# Patient Record
Sex: Male | Born: 1950 | Race: White | Hispanic: No | Marital: Married | Smoking: Former smoker
Health system: Southern US, Community
[De-identification: ages and names within clinical notes are randomized; demographics above are authoritative.]

## PROBLEM LIST (undated history)

## (undated) DIAGNOSIS — I639 Cerebral infarction, unspecified: Secondary | ICD-10-CM

## (undated) DIAGNOSIS — K219 Gastro-esophageal reflux disease without esophagitis: Secondary | ICD-10-CM

## (undated) DIAGNOSIS — H9319 Tinnitus, unspecified ear: Secondary | ICD-10-CM

## (undated) DIAGNOSIS — I10 Essential (primary) hypertension: Secondary | ICD-10-CM

## (undated) HISTORY — PX: COLONOSCOPY WITH ESOPHAGOGASTRODUODENOSCOPY (EGD): SHX5779

---

## 2004-04-08 ENCOUNTER — Ambulatory Visit: Payer: Self-pay

## 2011-11-13 ENCOUNTER — Ambulatory Visit: Payer: Self-pay | Admitting: Gastroenterology

## 2013-10-31 DIAGNOSIS — K219 Gastro-esophageal reflux disease without esophagitis: Secondary | ICD-10-CM | POA: Insufficient documentation

## 2013-10-31 DIAGNOSIS — E7800 Pure hypercholesterolemia, unspecified: Secondary | ICD-10-CM | POA: Insufficient documentation

## 2016-05-29 ENCOUNTER — Encounter: Payer: Self-pay | Admitting: *Deleted

## 2016-05-30 ENCOUNTER — Ambulatory Visit: Payer: Medicare Other | Admitting: Anesthesiology

## 2016-05-30 ENCOUNTER — Encounter
Admission: RE | Disposition: A | Discharge status: Home / Self Care | Payer: Self-pay | Source: Ambulatory Visit | Attending: Gastroenterology

## 2016-05-30 ENCOUNTER — Encounter: Payer: Self-pay | Admitting: Anesthesiology

## 2016-05-30 ENCOUNTER — Ambulatory Visit
Admission: RE | Admit: 2016-05-30 | Discharge: 2016-05-30 | Disposition: A | Discharge status: Home / Self Care | Payer: Medicare Other | Source: Ambulatory Visit | Attending: Gastroenterology | Admitting: Gastroenterology

## 2016-05-30 DIAGNOSIS — K621 Rectal polyp: Secondary | ICD-10-CM | POA: Diagnosis not present

## 2016-05-30 DIAGNOSIS — N183 Chronic kidney disease, stage 3 (moderate): Secondary | ICD-10-CM | POA: Insufficient documentation

## 2016-05-30 DIAGNOSIS — K640 First degree hemorrhoids: Secondary | ICD-10-CM | POA: Insufficient documentation

## 2016-05-30 DIAGNOSIS — Z1211 Encounter for screening for malignant neoplasm of colon: Secondary | ICD-10-CM | POA: Diagnosis not present

## 2016-05-30 DIAGNOSIS — K5730 Diverticulosis of large intestine without perforation or abscess without bleeding: Secondary | ICD-10-CM | POA: Insufficient documentation

## 2016-05-30 DIAGNOSIS — Z79899 Other long term (current) drug therapy: Secondary | ICD-10-CM | POA: Diagnosis not present

## 2016-05-30 DIAGNOSIS — K219 Gastro-esophageal reflux disease without esophagitis: Secondary | ICD-10-CM | POA: Insufficient documentation

## 2016-05-30 DIAGNOSIS — Z86010 Personal history of colonic polyps: Secondary | ICD-10-CM | POA: Insufficient documentation

## 2016-05-30 DIAGNOSIS — I129 Hypertensive chronic kidney disease with stage 1 through stage 4 chronic kidney disease, or unspecified chronic kidney disease: Secondary | ICD-10-CM | POA: Diagnosis not present

## 2016-05-30 DIAGNOSIS — Z7982 Long term (current) use of aspirin: Secondary | ICD-10-CM | POA: Diagnosis not present

## 2016-05-30 DIAGNOSIS — D128 Benign neoplasm of rectum: Secondary | ICD-10-CM | POA: Insufficient documentation

## 2016-05-30 HISTORY — DX: Tinnitus, unspecified ear: H93.19

## 2016-05-30 HISTORY — DX: Essential (primary) hypertension: I10

## 2016-05-30 HISTORY — DX: Gastro-esophageal reflux disease without esophagitis: K21.9

## 2016-05-30 HISTORY — PX: COLONOSCOPY WITH PROPOFOL: SHX5780

## 2016-05-30 SURGERY — COLONOSCOPY WITH PROPOFOL
Anesthesia: General

## 2016-05-30 MED ORDER — FENTANYL CITRATE (PF) 100 MCG/2ML IJ SOLN: 25.0000 ug | INTRAMUSCULAR | Status: DC | PRN

## 2016-05-30 MED ORDER — MIDAZOLAM HCL 2 MG/2ML IJ SOLN
INTRAMUSCULAR | Status: AC
  Filled 2016-05-30: qty 2

## 2016-05-30 MED ORDER — LIDOCAINE HCL (PF) 1 % IJ SOLN
INTRAMUSCULAR | Status: AC
  Administered 2016-05-30: 0.3 mL via INTRADERMAL
  Filled 2016-05-30: qty 2

## 2016-05-30 MED ORDER — PROPOFOL 500 MG/50ML IV EMUL
INTRAVENOUS | Status: AC
Start: 2016-05-30 — End: 2016-05-30
  Filled 2016-05-30: qty 50

## 2016-05-30 MED ORDER — LIDOCAINE HCL (PF) 2 % IJ SOLN
INTRAMUSCULAR | Status: AC
  Filled 2016-05-30: qty 2

## 2016-05-30 MED ORDER — MIDAZOLAM HCL 2 MG/2ML IJ SOLN
INTRAMUSCULAR | Status: DC | PRN
  Administered 2016-05-30: 1 mg via INTRAVENOUS

## 2016-05-30 MED ORDER — PROPOFOL 500 MG/50ML IV EMUL
INTRAVENOUS | Status: DC | PRN
  Administered 2016-05-30: 140 ug/kg/min via INTRAVENOUS

## 2016-05-30 MED ORDER — PROPOFOL 10 MG/ML IV BOLUS
INTRAVENOUS | Status: DC | PRN
  Administered 2016-05-30: 70 mg via INTRAVENOUS

## 2016-05-30 MED ORDER — ONDANSETRON HCL 4 MG/2ML IJ SOLN: 4.0000 mg | Freq: Once | INTRAMUSCULAR | Status: DC | PRN

## 2016-05-30 MED ORDER — LIDOCAINE HCL (CARDIAC) 20 MG/ML IV SOLN
INTRAVENOUS | Status: DC | PRN
  Administered 2016-05-30: 40 mg via INTRAVENOUS

## 2016-05-30 MED ORDER — LIDOCAINE HCL (PF) 1 % IJ SOLN
2.0000 mL | Freq: Once | INTRAMUSCULAR | Status: AC
  Administered 2016-05-30: 0.3 mL via INTRADERMAL

## 2016-05-30 MED ORDER — SODIUM CHLORIDE 0.9 % IV SOLN
INTRAVENOUS | Status: DC
  Administered 2016-05-30: 1000 mL via INTRAVENOUS

## 2016-05-30 MED ORDER — SODIUM CHLORIDE 0.9 % IV SOLN: INTRAVENOUS | Status: DC

## 2016-05-30 NOTE — Anesthesia Procedure Notes (Signed)
Date/Time: 05/30/2016 9:50 AM Performed by: Hedda Slade Pre-anesthesia Checklist: Patient identified, Emergency Drugs available, Suction available, Patient being monitored and Timeout performed Patient Re-evaluated:Patient Re-evaluated prior to inductionOxygen Delivery Method: Nasal cannula

## 2016-05-30 NOTE — Anesthesia Preprocedure Evaluation (Addendum)
Anesthesia Evaluation  Patient identified by MRN, date of birth, ID band Patient awake    Reviewed: Allergy & Precautions, NPO status , Patient's Chart, lab work & pertinent test results  Airway Mallampati: II  TM Distance: <3 FB     Dental  (+) Caps   Pulmonary neg pulmonary ROS,    Pulmonary exam normal        Cardiovascular hypertension, Pt. on medications Normal cardiovascular exam     Neuro/Psych negative neurological ROS  negative psych ROS   GI/Hepatic GERD  Medicated,  Endo/Other  negative endocrine ROS  Renal/GU Renal InsufficiencyRenal disease  negative genitourinary   Musculoskeletal negative musculoskeletal ROS (+)   Abdominal Normal abdominal exam  (+)   Peds negative pediatric ROS (+)  Hematology negative hematology ROS (+)   Anesthesia Other Findings Past Medical History: No date: CKD (chronic kidney disease) stage 3, GFR 30-5* No date: GERD (gastroesophageal reflux disease) No date: Hypertension No date: Tinnitus  Reproductive/Obstetrics                            Anesthesia Physical Anesthesia Plan  ASA: III  Anesthesia Plan: General   Post-op Pain Management:    Induction: Intravenous  Airway Management Planned: Nasal Cannula  Additional Equipment:   Intra-op Plan:   Post-operative Plan:   Informed Consent: I have reviewed the patients History and Physical, chart, labs and discussed the procedure including the risks, benefits and alternatives for the proposed anesthesia with the patient or authorized representative who has indicated his/her understanding and acceptance.   Dental advisory given  Plan Discussed with: CRNA and Surgeon  Anesthesia Plan Comments:         Anesthesia Quick Evaluation

## 2016-05-30 NOTE — Anesthesia Post-op Follow-up Note (Cosign Needed)
Anesthesia QCDR form completed.        

## 2016-05-30 NOTE — Op Note (Signed)
[facility] Gastroenterology Patient Name: Joel Ball Procedure Date: 05/30/2016 9:39 AM MRN: [id number] Account #: [id number] Date of Birth: 24-Aug-1950 Admit Type: Outpatient Age: 66 Room: [facility] ENDO ROOM 3 Gender: Male Note Status: Finalized Procedure:            Colonoscopy Indications:          Personal history of colonic polyps Providers:            Lollie Sails, MD Medicines:            Monitored Anesthesia Care Complications:        No immediate complications. Procedure:            Pre-Anesthesia Assessment:                       - ASA Grade Assessment: III - A patient with severe                        systemic disease.                       After obtaining informed consent, the colonoscope was                        passed under direct vision. Throughout the procedure,                        the patient's blood pressure, pulse, and oxygen                        saturations were monitored continuously. The                        Colonoscope was introduced through the anus and                        advanced to the the cecum, identified by appendiceal                        orifice and ileocecal valve. The quality of the bowel                        preparation was good. Findings:      Two sessile polyps were found in the rectum. The polyps were 1 to 2 mm       in size. These polyps were removed with a cold biopsy forceps. Resection       and retrieval were complete.      Multiple small and large-mouthed diverticula were found in the sigmoid       colon and distal descending colon.      Non-bleeding internal hemorrhoids were found during anoscopy. The       hemorrhoids were small and Grade I (internal hemorrhoids that do not       prolapse). Impression:           - Two 1 to 2 mm polyps in the rectum, removed with a                        cold biopsy forceps. Resected and retrieved.                       -  Diverticulosis in the sigmoid colon and in  the distal                        descending colon.                       - Non-bleeding internal hemorrhoids. Recommendation:       - Await pathology results.                       - Telephone GI clinic for pathology results in 1 week. Procedure Code(s):    --- Professional ---                       [phone], Colonoscopy, flexible; with biopsy, single or                        multiple Diagnosis Code(s):    --- Professional ---                       K62.1, Rectal polyp                       K64.0, First degree hemorrhoids                       Z86.010, Personal history of colonic polyps                       K57.30, Diverticulosis of large intestine without                        perforation or abscess without bleeding CPT copyright 2016 American Medical Association. All rights reserved. The codes documented in this report are preliminary and upon coder review may  be revised to meet current compliance requirements. Lollie Sails, MD 05/30/2016 10:30:26 AM This report has been signed electronically. Number of Addenda: 0 Note Initiated On: 05/30/2016 9:39 AM Scope Withdrawal Time: 0 hours 8 minutes 13 seconds  Total Procedure Duration: 0 hours 14 minutes 25 seconds       [facility]

## 2016-05-30 NOTE — Transfer of Care (Signed)
Immediate Anesthesia Transfer of Care Note  Patient: Joel Ball.  Procedure(s) Performed: Procedure(s): COLONOSCOPY WITH PROPOFOL (N/A)  Patient Location: PACU  Anesthesia Type:General  Level of Consciousness: awake, alert  and oriented  Airway & Oxygen Therapy: Patient Spontanous Breathing and Patient connected to nasal cannula oxygen  Post-op Assessment: Report given to RN and Post -op Vital signs reviewed and stable  Post vital signs: Reviewed and stable  Last Vitals:  Vitals:   05/30/16 0845 05/30/16 1015  BP: 139/74 96/71  Pulse: 67 60  Resp: 17 18  Temp: (!) 36.1 C 36.3 C    Last Pain:  Vitals:   05/30/16 1015  TempSrc: Tympanic         Complications: No apparent anesthesia complications

## 2016-05-30 NOTE — H&P (Signed)
Outpatient short stay form Pre-procedure 05/30/2016 9:44 AM Lollie Sails MD  Primary Physician: Dr Derinda Late  Reason for visit:  Colonoscopy  History of present illness:  Patient is a 66 year old male presenting today as above. He has a personal history of adenomatous colon polyps. He tolerated his prep well. He takes 81 mg aspirin that he is held for a couple of days. He takes no other blood thinning agents or aspirin products. His last colonoscopy was in 2013.    Current Facility-Administered Medications:  .  0.9 %  sodium chloride infusion, , Intravenous, Continuous, Lollie Sails, MD, Last Rate: 20 mL/hr at 05/30/16 0902, 1,000 mL at 05/30/16 0902 .  0.9 %  sodium chloride infusion, , Intravenous, Continuous, Lollie Sails, MD .  fentaNYL (SUBLIMAZE) injection 25 mcg, 25 mcg, Intravenous, Q5 min PRN, Alvin Critchley, MD .  ondansetron [facility]) injection 4 mg, 4 mg, Intravenous, Once PRN, Alvin Critchley, MD  Prescriptions Prior to Admission  Medication Sig Dispense Refill Last Dose  . aspirin EC 81 MG tablet Take 81 mg by mouth daily.   05/25/2016  . losartan (COZAAR) 100 MG tablet Take 100 mg by mouth daily.     Marland Kitchen omeprazole (PRILOSEC) 20 MG capsule Take 20 mg by mouth daily.     . rosuvastatin (CRESTOR) 5 MG tablet Take 5 mg by mouth daily.        Allergies  Allergen Reactions  . Statins      Past Medical History:  Diagnosis Date  . CKD (chronic kidney disease) stage 3, GFR 30-59 ml/min   . GERD (gastroesophageal reflux disease)   . Hypertension   . Tinnitus     Review of systems:      Physical Exam    Heart and lungs: Regular rate and rhythm without rub or gallop, lungs are bilaterally clear.    HEENT: Normocephalic atraumatic eyes are anicteric    Other:     Pertinant exam for procedure: Soft nontender nondistended bowel sounds are positive normoactive    Planned proceedures: Colonoscopy and indicated procedures I have discussed the risks  benefits and complications of procedures to include not limited to bleeding, infection, perforation and the risk of sedation and the patient wishes to proceed.    Lollie Sails, MD Gastroenterology 05/30/2016  9:44 AM

## 2016-05-31 ENCOUNTER — Encounter: Payer: Self-pay | Admitting: Gastroenterology

## 2016-05-31 LAB — SURGICAL PATHOLOGY

## 2016-05-31 NOTE — Anesthesia Postprocedure Evaluation (Signed)
Anesthesia Post Note  Patient: Joel Ball.  Procedure(s) Performed: Procedure(s) (LRB): COLONOSCOPY WITH PROPOFOL (N/A)  Patient location during evaluation: PACU Anesthesia Type: General Level of consciousness: awake and alert and oriented Pain management: pain level controlled Vital Signs Assessment: post-procedure vital signs reviewed and stable Respiratory status: spontaneous breathing Cardiovascular status: blood pressure returned to baseline Anesthetic complications: no     Last Vitals:  Vitals:   05/30/16 1030 05/30/16 1040  BP: 118/72 115/72  Pulse: 65 60  Resp: 17 12  Temp:      Last Pain:  Vitals:   05/30/16 1015  TempSrc: Tympanic                 Biviana Saddler

## 2016-09-07 ENCOUNTER — Other Ambulatory Visit: Payer: Self-pay | Admitting: Family Medicine

## 2016-09-07 DIAGNOSIS — G453 Amaurosis fugax: Secondary | ICD-10-CM

## 2016-09-11 ENCOUNTER — Ambulatory Visit
Admission: RE | Admit: 2016-09-11 | Discharge: 2016-09-11 | Disposition: A | Discharge status: Home / Self Care | Payer: Medicare Other | Source: Ambulatory Visit | Attending: Family Medicine | Admitting: Family Medicine

## 2016-09-11 DIAGNOSIS — I6523 Occlusion and stenosis of bilateral carotid arteries: Secondary | ICD-10-CM | POA: Insufficient documentation

## 2016-09-11 DIAGNOSIS — G453 Amaurosis fugax: Secondary | ICD-10-CM | POA: Diagnosis present

## 2016-09-13 ENCOUNTER — Encounter (INDEPENDENT_AMBULATORY_CARE_PROVIDER_SITE_OTHER): Payer: Self-pay | Admitting: Vascular Surgery

## 2016-09-13 ENCOUNTER — Ambulatory Visit (INDEPENDENT_AMBULATORY_CARE_PROVIDER_SITE_OTHER): Payer: Medicare Other | Admitting: Vascular Surgery

## 2016-09-13 VITALS — BP 121/69 | HR 70 | Resp 17 | Wt 182.0 lb

## 2016-09-13 DIAGNOSIS — I10 Essential (primary) hypertension: Secondary | ICD-10-CM | POA: Diagnosis not present

## 2016-09-13 DIAGNOSIS — H34231 Retinal artery branch occlusion, right eye: Secondary | ICD-10-CM | POA: Insufficient documentation

## 2016-09-13 DIAGNOSIS — H34232 Retinal artery branch occlusion, left eye: Secondary | ICD-10-CM | POA: Diagnosis not present

## 2016-09-13 DIAGNOSIS — I6523 Occlusion and stenosis of bilateral carotid arteries: Secondary | ICD-10-CM | POA: Diagnosis not present

## 2016-09-13 DIAGNOSIS — I6529 Occlusion and stenosis of unspecified carotid artery: Secondary | ICD-10-CM | POA: Insufficient documentation

## 2016-09-13 NOTE — Patient Instructions (Signed)
Carotid Artery Disease The carotid arteries are arteries on both sides of the neck. They carry blood to the brain. Carotid artery disease is when the arteries get smaller (narrow) or get blocked. If these arteries get smaller or get blocked, you are more likely to have a stroke or warning stroke (transient ischemic attack). Follow these instructions at home:  Take medicines as told by your doctor. Make sure you understand all your medicine instructions. Do not stop your medicines without talking to your doctor first.  Follow your doctor's diet instructions. It is important to eat a healthy diet that includes plenty of: ? Fresh fruits. ? Vegetables. ? Lean meats.  Avoid: ? High-fat foods. ? High-sodium foods. ? Foods that are fried, overly processed, or have poor nutritional value.  Stay a healthy weight.  Stay active. Get at least 30 minutes of activity every day.  Do not smoke.  Limit alcohol use to: ? No more than 2 drinks a day for men. ? No more than 1 drink a day for women who are not pregnant.  Do not use illegal drugs.  Keep all doctor visits as told. Get help right away if:  You have sudden weakness or loss of feeling (numbness) on one side of the body, such as the face, arm, or leg.  You have sudden confusion.  You have trouble speaking (aphasia) or understanding.  You have sudden trouble seeing out of one or both eyes.  You have sudden trouble walking.  You have dizziness or feel like you might pass out (faint).  You have a loss of balance or your movements are not steady (uncoordinated).  You have a sudden, severe headache with no known cause.  You have trouble swallowing (dysphagia). Call your local emergency services (911 in [place] Do notdrive yourself to the clinic or hospital. This information is not intended to replace advice given to you by your health care provider. Make sure you discuss any questions you have with your health care  provider. Document Released: 01/03/2012 Document Revised: 06/24/2015 Document Reviewed: 07/17/2012 Elsevier Interactive Patient Education  2018 Elsevier Inc.  

## 2016-09-13 NOTE — Assessment & Plan Note (Signed)
blood pressure control important in reducing the progression of atherosclerotic disease. On appropriate oral medications.  

## 2016-09-13 NOTE — Assessment & Plan Note (Signed)
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

## 2016-09-13 NOTE — Assessment & Plan Note (Signed)
The patient has left sided visual symptoms prompting a carotid evaluation. The patient has now progressed and has a lesion the is >70%.  Patient should undergo CT angiography of the carotid arteries to define the degree of stenosis of the internal carotid arteries bilaterally and the anatomic suitability for surgery vs. intervention.  If the patient does indeed need surgery cardiac clearance will be required, once cleared the patient will be scheduled for surgery.  The risks, benefits and alternative therapies were reviewed in detail with the patient.  All questions were answered.  The patient agrees to proceed with imaging.  Continue antiplatelet therapy as prescribed. Continue management of CAD, HTN and Hyperlipidemia. Healthy heart diet, encouraged exercise at least 4 times per week.

## 2016-09-13 NOTE — Progress Notes (Signed)
Patient ID: Joel Ball., male   DOB: 10-14-1950, 66 y.o.   MRN: [id number]  Chief Complaint  Patient presents with  . carotid stenosis    HPI Joel Ball. is a 66 y.o. male.  I am asked to see the patient by Dr. Baldemar Lenis for evaluation of carotid artery stenosis.  The patient reports one week ago waking up with a visual field defect. There were no inciting events. On evaluation, this was largely in the left eye. If he closed his left eye, he did not see the defect. He described the area as looking like the [place]. 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 This resulted in a carotid duplex performed earlier this week which had velocities that were interpreted by the radiologist as having high-grade, 70-99% carotid artery stenosis bilaterally. The right side velocities would certainly be borderline for this but the left side velocities were likely in the high-grade range. He denies arm or leg weakness or numbness. He denies speech or swallowing difficulty. No fever or chills.   Past Medical History:  Diagnosis Date  . CKD (chronic kidney disease) stage 3, GFR 30-59 ml/min   . GERD (gastroesophageal reflux disease)   . Hypertension   . Tinnitus     Past Surgical History:  Procedure Laterality Date  . COLONOSCOPY WITH ESOPHAGOGASTRODUODENOSCOPY (EGD)    . COLONOSCOPY WITH PROPOFOL N/A 05/30/2016   Procedure: COLONOSCOPY WITH PROPOFOL;  Surgeon: Lollie Sails, MD;  Location: [facility] ENDOSCOPY;  Service: Endoscopy;  Laterality: N/A;    Family History No bleeding disorders, clotting disorders, aneurysms, or porphyrias  Social History Social History  Substance Use Topics  . Smoking status: Never Smoker  . Smokeless tobacco: Never Used  . Alcohol use No  No IVDU  Allergies  Allergen Reactions  . Statins     Current Outpatient Prescriptions    Medication Sig Dispense Refill  . aspirin EC 81 MG tablet Take 81 mg by mouth daily.    Marland Kitchen losartan (COZAAR) 100 MG tablet Take 100 mg by mouth daily.    Marland Kitchen omeprazole (PRILOSEC) 20 MG capsule Take 20 mg by mouth daily.    . rosuvastatin (CRESTOR) 5 MG tablet Take 5 mg by mouth daily.     No current facility-administered medications for this visit.       REVIEW OF SYSTEMS (Negative unless checked)  Constitutional: [] Weight loss  [] Fever  [] Chills Cardiac: [] Chest pain   [] Chest pressure   [] Palpitations   [] Shortness of breath when laying flat   [] Shortness of breath at rest   [] Shortness of breath with exertion. Vascular:  [] Pain in legs with walking   [] Pain in legs at rest   [] Pain in legs when laying flat   [] Claudication   [] Pain in feet when walking  [] Pain in feet at rest  [] Pain in feet when laying flat   [] History of DVT   [] Phlebitis   [] Swelling in legs   [] Varicose veins   [] Non-healing ulcers Pulmonary:   [] Uses home oxygen   [] Productive cough   [] Hemoptysis   [] Wheeze  [] COPD   [] Asthma Neurologic:  [] Dizziness  [] Blackouts   [] Seizures   [] History of stroke   [] History of TIA  [] Aphasia   [x] Temporary blindness   [] Dysphagia   [] Weakness or numbness in arms   [] Weakness or numbness in legs Musculoskeletal:  []   Arthritis   [] Joint swelling   [] Joint pain   [] Low back pain Hematologic:  [] Easy bruising  [] Easy bleeding   [] Hypercoagulable state   [] Anemic  [] Hepatitis Gastrointestinal:  [] Blood in stool   [] Vomiting blood  [x] Gastroesophageal reflux/heartburn   [] Abdominal pain Genitourinary:  [x] Chronic kidney disease   [] Difficult urination  [] Frequent urination  [] Burning with urination   [] Hematuria Skin:  [] Rashes   [] Ulcers   [] Wounds Psychological:  [] History of anxiety   []  History of major depression.    Physical Exam BP 121/69   Pulse 70   Resp 17   Wt 82.6 kg (182 lb)   BMI 27.67 kg/m  Gen:  WD/WN, NAD Head: [place]/AT, No temporalis wasting.  Ear/Nose/Throat:  Hearing grossly intact, nares w/o erythema or drainage, oropharynx w/o Erythema/Exudate Eyes: Conjunctiva clear, sclera non-icteric  Neck: trachea midline.  No JVD. Very soft right carotid bruit.  Pulmonary:  Good air movement, clear to auscultation bilaterally.  Cardiac: RRR, normal S1, S2 Vascular:  Vessel Right Left  Radial Palpable Palpable                      Popliteal Palpable Palpable  PT Palpable Palpable  DP Palpable Palpable   Gastrointestinal: soft, non-tender/non-distended. Musculoskeletal: M/S 5/5 throughout.  Extremities without ischemic changes.  No deformity or atrophy. No edema. Neurologic: Sensation grossly intact in extremities.  Symmetrical.  Speech is fluent. Motor exam as listed above. Psychiatric: Judgment intact, Mood & affect appropriate for pt's clinical situation. Dermatologic: No rashes or ulcers noted.  No cellulitis or open wounds.    Radiology US Carotid Bilateral  Result Date: 09/11/2016 CLINICAL DATA:  66 year old male with left amaurosis fugax EXAM: BILATERAL CAROTID DUPLEX ULTRASOUND TECHNIQUE: Pearline Cables scale imaging, color Doppler and duplex ultrasound were performed of bilateral carotid and vertebral arteries in the neck. COMPARISON:  None. FINDINGS: Criteria: Quantification of carotid stenosis is based on velocity parameters that correlate the residual internal carotid diameter with NASCET-based stenosis levels, using the diameter of the distal internal carotid lumen as the denominator for stenosis measurement. The following velocity measurements were obtained: RIGHT ICA:  225/64 cm/sec CCA:  601/09 cm/sec SYSTOLIC ICA/CCA RATIO:  2.1 DIASTOLIC ICA/CCA RATIO:  2.5 ECA:  98 cm/sec LEFT ICA:  287/110 cm/sec CCA:  32/35 cm/sec SYSTOLIC ICA/CCA RATIO:  3.0 DIASTOLIC ICA/CCA RATIO:  5.2 ECA:  131 cm/sec RIGHT CAROTID ARTERY: Bulky heterogeneous atherosclerotic plaque resulting in significant elevation of the peak systolic velocity as well as spectral  broadening, and high velocity jetting. By peak systolic velocity criteria the estimated stenosis falls in the 70- 99% range. RIGHT VERTEBRAL ARTERY:  Patent with normal antegrade flow. LEFT CAROTID ARTERY: Bulky heterogeneous atherosclerotic plaque in the proximal internal carotid artery. By peak systolic velocity criteria the estimated stenosis is a greater than 70%. There is spectral broadening and high velocity jetting. LEFT VERTEBRAL ARTERY:  Patent with normal antegrade flow. IMPRESSION: 1. High grade (70-99%) stenosis proximal right internal carotid artery secondary to bulky heterogeneous atherosclerotic plaque. 2. High grade (70-99%) stenosis proximal left internal carotid artery secondary to bulky heterogeneous atherosclerotic plaque. Subjectively, the left-sided stenosis is greater than the right. 3. Vertebral arteries are patent with normal antegrade flow. Signed, Criselda Peaches, MD [facility] Radiology Electronically Signed   By: Jacqulynn Cadet M.D.   On: 09/11/2016 12:09    Labs No results found for this or any previous visit (from the past 2160 hour(s)).  Assessment/Plan:  Hypertension  blood pressure control important in reducing the progression of atherosclerotic disease. On appropriate oral medications.   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 The patient has now progressed and has a lesion the is >70%.  Patient should undergo CT angiography of the carotid arteries to define the degree of stenosis of the internal carotid arteries bilaterally and the anatomic suitability for surgery vs. intervention.  If the patient does indeed need surgery cardiac clearance will be required, once cleared the patient will be scheduled for surgery.  The risks, benefits and alternative therapies were  reviewed in detail with the patient.  All questions were answered.  The patient agrees to proceed with imaging.  Continue antiplatelet therapy as prescribed. Continue management of CAD, HTN and Hyperlipidemia. Healthy heart diet, encouraged exercise at least 4 times per week.        Leotis Pain 09/13/2016, 10:03 AM   This note was created with Dragon medical transcription system.  Any errors from dictation are unintentional.

## 2016-09-14 ENCOUNTER — Telehealth (INDEPENDENT_AMBULATORY_CARE_PROVIDER_SITE_OTHER): Payer: Self-pay | Admitting: Vascular Surgery

## 2016-09-14 ENCOUNTER — Ambulatory Visit
Admission: RE | Admit: 2016-09-14 | Discharge: 2016-09-14 | Disposition: A | Discharge status: Home / Self Care | Payer: Medicare Other | Source: Ambulatory Visit | Attending: Vascular Surgery | Admitting: Vascular Surgery

## 2016-09-14 DIAGNOSIS — I6523 Occlusion and stenosis of bilateral carotid arteries: Secondary | ICD-10-CM | POA: Insufficient documentation

## 2016-09-14 LAB — POCT I-STAT CREATININE: Creatinine, Ser: 1.4 mg/dL — ABNORMAL HIGH (ref 0.61–1.24)

## 2016-09-14 MED ORDER — IOPAMIDOL (ISOVUE-370) INJECTION 76%
75.0000 mL | Freq: Once | INTRAVENOUS | Status: AC | PRN
  Administered 2016-09-14: 75 mL via INTRAVENOUS

## 2016-09-14 NOTE — Telephone Encounter (Signed)
Getting patient scheduled for his LCEA on 09/27/16

## 2016-09-14 NOTE — Telephone Encounter (Signed)
Remind me to call him tomorrow and lets go ahead and get him scheduled for left carotid endarterectomy

## 2016-09-14 NOTE — Telephone Encounter (Signed)
SCHEDULING IS SAYING THAT DR Lucky Cowboy NEEDS TO SIGN OFF ON Keian'S AUTHORIZATION IN THE WORK QUE. THEY WENT ADD AND GOT HIM IN TODAY

## 2016-09-14 NOTE — Telephone Encounter (Signed)
Imaging got Joel Ball in today. Say that they need you to sign off in the work que.  Also his report is: 1. Severe bulk stenosis over 75% due to calcified plaque 2. 50-60% ICA bulk stenosis 3.[place] patent co dominant vertabral artery

## 2016-09-15 ENCOUNTER — Encounter (INDEPENDENT_AMBULATORY_CARE_PROVIDER_SITE_OTHER): Payer: Self-pay

## 2016-09-19 ENCOUNTER — Other Ambulatory Visit (INDEPENDENT_AMBULATORY_CARE_PROVIDER_SITE_OTHER): Payer: Self-pay

## 2016-09-20 ENCOUNTER — Encounter
Admission: RE | Admit: 2016-09-20 | Discharge: 2016-09-20 | Disposition: A | Discharge status: Home / Self Care | Payer: Medicare Other | Source: Ambulatory Visit | Attending: Vascular Surgery | Admitting: Vascular Surgery

## 2016-09-20 DIAGNOSIS — I10 Essential (primary) hypertension: Secondary | ICD-10-CM | POA: Diagnosis present

## 2016-09-20 DIAGNOSIS — Z01812 Encounter for preprocedural laboratory examination: Secondary | ICD-10-CM | POA: Diagnosis present

## 2016-09-20 DIAGNOSIS — Z01810 Encounter for preprocedural cardiovascular examination: Secondary | ICD-10-CM | POA: Insufficient documentation

## 2016-09-20 LAB — PROTIME-INR
INR: 0.97
PROTHROMBIN TIME: 12.9 s (ref 11.4–15.2)

## 2016-09-20 LAB — COMPREHENSIVE METABOLIC PANEL
ALT: 21 U/L (ref 17–63)
ANION GAP: 7 (ref 5–15)
AST: 22 U/L (ref 15–41)
Albumin: 4.3 g/dL (ref 3.5–5.0)
Alkaline Phosphatase: 43 U/L (ref 38–126)
BUN: 16 mg/dL (ref 6–20)
CHLORIDE: 106 mmol/L (ref 101–111)
CO2: 29 mmol/L (ref 22–32)
CREATININE: 1.22 mg/dL (ref 0.61–1.24)
Calcium: 9.5 mg/dL (ref 8.9–10.3)
GFR calc non Af Amer: 59 mL/min — ABNORMAL LOW (ref >60)
Glucose, Bld: 92 mg/dL (ref 65–99)
POTASSIUM: 4.6 mmol/L (ref 3.5–5.1)
SODIUM: 142 mmol/L (ref 135–145)
Total Bilirubin: 0.7 mg/dL (ref 0.3–1.2)
Total Protein: 7.4 g/dL (ref 6.5–8.1)

## 2016-09-20 LAB — TYPE AND SCREEN
ABO/RH(D): O POS
ANTIBODY SCREEN: NEGATIVE

## 2016-09-20 LAB — APTT: APTT: 29 s (ref 24–36)

## 2016-09-20 LAB — SURGICAL PCR SCREEN
MRSA, PCR: NEGATIVE
STAPHYLOCOCCUS AUREUS: NEGATIVE

## 2016-09-20 NOTE — Patient Instructions (Signed)
  Your procedure is scheduled on:Wednesday August 29 , 2018. Report to Same Day Surgery. To find out your arrival time please call [phone] between 1PM - 3PM on Tuesday September 26, 2016.  Remember: Instructions that are not followed completely may result in serious medical risk, up to and including death, or upon the discretion of your surgeon and anesthesiologist your surgery may need to be rescheduled.    _x___ 1. Do not eat food or drink liquids after midnight. No gum chewing or hard candies.     _x___ 2. No Alcohol for 24 hours before or after surgery.   ____ 3. Bring all medications with you on the day of surgery if instructed.    __x__ 4. Notify your doctor if there is any change in your medical condition     (cold, fever, infections).    _____ 5. No smoking 24 hours prior to surgery.     Do not wear jewelry, make-up, hairpins, clips or nail polish.  Do not wear lotions, powders, or perfumes.   Do not shave 48 hours prior to surgery. Men may shave face and neck.  Do not bring valuables to the hospital.    [facility] is not responsible for any belongings or valuables.               Contacts, dentures or bridgework may not be worn into surgery.  Leave your suitcase in the car. After surgery it may be brought to your room.  For patients admitted to the hospital, discharge time is determined by your treatment team.   Patients discharged the day of surgery will not be allowed to drive home.    Please read over the following fact sheets that you were given:   [facility] Preparing for Surgery  __x__ Take these medicines the morning of surgery with A SIP OF WATER:    1. omeprazole (PRILOSEC) take at bedtime the night prior to surgery and the morning of surgery.    ____ Fleet Enema (as directed)   __x__ Use CHG Soap as directed on instruction sheet  ____ Use inhalers on the day of surgery and bring to hospital day of surgery  ____ Stop metformin 2 days prior to  surgery    ____ Take 1/2 of usual insulin dose the night before surgery and none on the morning of surgery.   ____ Stop Coumadin/Plavix/aspirin on does not apply.  __x__ Stop Anti-inflammatories such as Advil, Aleve, Ibuprofen, Motrin, Naproxen, EXCEDRIN MIGRAINE, Naprosyn,  Goodies powders or aspirin products. OK to take Tylenol.   ____ Stop supplements until after surgery.    ____ Bring C-Pap to the hospital.

## 2016-09-26 ENCOUNTER — Encounter (INDEPENDENT_AMBULATORY_CARE_PROVIDER_SITE_OTHER): Payer: Self-pay | Admitting: Vascular Surgery

## 2016-09-27 ENCOUNTER — Encounter: Payer: Self-pay | Admitting: *Deleted

## 2016-09-27 ENCOUNTER — Inpatient Hospital Stay: Payer: Medicare Other | Admitting: Anesthesiology

## 2016-09-27 ENCOUNTER — Inpatient Hospital Stay
Admission: RE | Admit: 2016-09-27 | Discharge: 2016-09-28 | DRG: 039 | Disposition: A | Discharge status: Home / Self Care | Payer: Medicare Other | Source: Ambulatory Visit | Attending: Vascular Surgery | Admitting: Vascular Surgery

## 2016-09-27 ENCOUNTER — Encounter
Admission: RE | Disposition: A | Discharge status: Home / Self Care | Payer: Self-pay | Source: Ambulatory Visit | Attending: Vascular Surgery

## 2016-09-27 DIAGNOSIS — I739 Peripheral vascular disease, unspecified: Secondary | ICD-10-CM | POA: Diagnosis present

## 2016-09-27 DIAGNOSIS — I6522 Occlusion and stenosis of left carotid artery: Secondary | ICD-10-CM | POA: Diagnosis present

## 2016-09-27 DIAGNOSIS — K219 Gastro-esophageal reflux disease without esophagitis: Secondary | ICD-10-CM | POA: Diagnosis present

## 2016-09-27 DIAGNOSIS — I10 Essential (primary) hypertension: Secondary | ICD-10-CM | POA: Diagnosis present

## 2016-09-27 HISTORY — PX: ENDARTERECTOMY: SHX5162

## 2016-09-27 LAB — ABO/RH: ABO/RH(D): O POS

## 2016-09-27 LAB — GLUCOSE, CAPILLARY: Glucose-Capillary: 110 mg/dL — ABNORMAL HIGH (ref 65–99)

## 2016-09-27 SURGERY — ENDARTERECTOMY, CAROTID
Anesthesia: General | Laterality: Left | Wound class: Clean

## 2016-09-27 MED ORDER — MAGNESIUM SULFATE 2 GM/50ML IV SOLN: 2.0000 g | Freq: Every day | INTRAVENOUS | Status: DC | PRN

## 2016-09-27 MED ORDER — SODIUM CHLORIDE 0.9 % IV SOLN
INTRAVENOUS | Status: DC
  Administered 2016-09-27 (×2): via INTRAVENOUS

## 2016-09-27 MED ORDER — CLOPIDOGREL BISULFATE 75 MG PO TABS
75.0000 mg | ORAL_TABLET | Freq: Every day | ORAL | Status: DC
  Administered 2016-09-28: 75 mg via ORAL
  Filled 2016-09-27: qty 1

## 2016-09-27 MED ORDER — OXYCODONE-ACETAMINOPHEN 5-325 MG PO TABS
1.0000 | ORAL_TABLET | ORAL | Status: DC | PRN
  Administered 2016-09-27 (×4): 1 via ORAL
  Filled 2016-09-27 (×4): qty 1

## 2016-09-27 MED ORDER — NITROGLYCERIN IN D5W 200-5 MCG/ML-% IV SOLN: 5.0000 ug/min | INTRAVENOUS | Status: DC

## 2016-09-27 MED ORDER — POTASSIUM CHLORIDE CRYS ER 20 MEQ PO TBCR: 20.0000 meq | EXTENDED_RELEASE_TABLET | Freq: Every day | ORAL | Status: DC | PRN

## 2016-09-27 MED ORDER — ONDANSETRON HCL 4 MG/2ML IJ SOLN: 4.0000 mg | Freq: Four times a day (QID) | INTRAMUSCULAR | Status: DC | PRN

## 2016-09-27 MED ORDER — ONDANSETRON HCL 4 MG/2ML IJ SOLN
INTRAMUSCULAR | Status: DC | PRN
  Administered 2016-09-27: 4 mg via INTRAVENOUS

## 2016-09-27 MED ORDER — DOCUSATE SODIUM 100 MG PO CAPS
100.0000 mg | ORAL_CAPSULE | Freq: Every day | ORAL | Status: DC
  Administered 2016-09-28: 100 mg via ORAL
  Filled 2016-09-27: qty 1

## 2016-09-27 MED ORDER — PANTOPRAZOLE SODIUM 40 MG PO TBEC: 40.0000 mg | DELAYED_RELEASE_TABLET | Freq: Every day | ORAL | Status: DC

## 2016-09-27 MED ORDER — LIDOCAINE HCL (PF) 2 % IJ SOLN
INTRAMUSCULAR | Status: AC
  Filled 2016-09-27: qty 2

## 2016-09-27 MED ORDER — FAMOTIDINE IN NACL 20-0.9 MG/50ML-% IV SOLN
20.0000 mg | Freq: Two times a day (BID) | INTRAVENOUS | Status: DC
  Administered 2016-09-27: 20 mg via INTRAVENOUS
  Filled 2016-09-27: qty 50

## 2016-09-27 MED ORDER — LIDOCAINE HCL 1 % IJ SOLN
INTRAMUSCULAR | Status: DC | PRN
  Administered 2016-09-27: 10 mL

## 2016-09-27 MED ORDER — FENTANYL CITRATE (PF) 100 MCG/2ML IJ SOLN
INTRAMUSCULAR | Status: AC
  Administered 2016-09-27: 25 ug via INTRAVENOUS
  Filled 2016-09-27: qty 2

## 2016-09-27 MED ORDER — DEXAMETHASONE SODIUM PHOSPHATE 10 MG/ML IJ SOLN
INTRAMUSCULAR | Status: AC
  Filled 2016-09-27: qty 1

## 2016-09-27 MED ORDER — PHENOL 1.4 % MT LIQD
1.0000 | OROMUCOSAL | Status: DC | PRN
  Filled 2016-09-27: qty 177

## 2016-09-27 MED ORDER — PROPOFOL 10 MG/ML IV BOLUS
INTRAVENOUS | Status: AC
  Filled 2016-09-27: qty 20

## 2016-09-27 MED ORDER — SUCCINYLCHOLINE CHLORIDE 20 MG/ML IJ SOLN
INTRAMUSCULAR | Status: AC
  Filled 2016-09-27: qty 1

## 2016-09-27 MED ORDER — SODIUM CHLORIDE 0.9 % IV SOLN
INTRAVENOUS | Status: DC | PRN
  Administered 2016-09-27: 80 mL

## 2016-09-27 MED ORDER — SODIUM CHLORIDE 0.9 % IV SOLN
INTRAVENOUS | Status: DC | PRN
  Administered 2016-09-27: 40 mL via INTRAMUSCULAR

## 2016-09-27 MED ORDER — SODIUM CHLORIDE 0.9 % IV SOLN: 500.0000 mL | Freq: Once | INTRAVENOUS | Status: DC | PRN

## 2016-09-27 MED ORDER — EVICEL 2 ML EX KIT
PACK | CUTANEOUS | Status: AC
  Filled 2016-09-27: qty 1

## 2016-09-27 MED ORDER — GUAIFENESIN-DM 100-10 MG/5ML PO SYRP
15.0000 mL | ORAL_SOLUTION | ORAL | Status: DC | PRN
  Filled 2016-09-27: qty 15

## 2016-09-27 MED ORDER — DEXTROSE 5 % IV SOLN
1.5000 g | Freq: Two times a day (BID) | INTRAVENOUS | Status: AC
  Administered 2016-09-27 – 2016-09-28 (×2): 1.5 g via INTRAVENOUS
  Filled 2016-09-27 (×3): qty 1.5

## 2016-09-27 MED ORDER — METOPROLOL TARTRATE 5 MG/5ML IV SOLN: 2.0000 mg | INTRAVENOUS | Status: DC | PRN

## 2016-09-27 MED ORDER — FENTANYL CITRATE (PF) 100 MCG/2ML IJ SOLN
INTRAMUSCULAR | Status: AC
  Filled 2016-09-27: qty 2

## 2016-09-27 MED ORDER — PHENYLEPHRINE HCL 10 MG/ML IJ SOLN
INTRAMUSCULAR | Status: DC | PRN
  Administered 2016-09-27 (×2): 100 ug via INTRAVENOUS
  Administered 2016-09-27: 50 ug via INTRAVENOUS
  Administered 2016-09-27: 100 ug via INTRAVENOUS
  Administered 2016-09-27: 200 ug via INTRAVENOUS
  Administered 2016-09-27 (×2): 100 ug via INTRAVENOUS

## 2016-09-27 MED ORDER — ESMOLOL HCL 100 MG/10ML IV SOLN
INTRAVENOUS | Status: DC | PRN
  Administered 2016-09-27: 40 mg via INTRAVENOUS

## 2016-09-27 MED ORDER — ASPIRIN-ACETAMINOPHEN-CAFFEINE 250-250-65 MG PO TABS
1.0000 | ORAL_TABLET | Freq: Every day | ORAL | Status: DC | PRN
  Filled 2016-09-27: qty 1

## 2016-09-27 MED ORDER — CEFAZOLIN SODIUM-DEXTROSE 2-4 GM/100ML-% IV SOLN
2.0000 g | Freq: Once | INTRAVENOUS | Status: AC
  Administered 2016-09-27: 2 g via INTRAVENOUS

## 2016-09-27 MED ORDER — SODIUM CHLORIDE 0.9 % IV SOLN
INTRAVENOUS | Status: DC
  Administered 2016-09-27 – 2016-09-28 (×2): via INTRAVENOUS

## 2016-09-27 MED ORDER — CEFAZOLIN SODIUM 1 G IJ SOLR
INTRAMUSCULAR | Status: AC
  Filled 2016-09-27: qty 10

## 2016-09-27 MED ORDER — MORPHINE SULFATE (PF) 2 MG/ML IV SOLN: 2.0000 mg | INTRAVENOUS | Status: DC | PRN

## 2016-09-27 MED ORDER — ACETAMINOPHEN 325 MG PO TABS: 325.0000 mg | ORAL_TABLET | ORAL | Status: DC | PRN

## 2016-09-27 MED ORDER — ROCURONIUM BROMIDE 50 MG/5ML IV SOLN
INTRAVENOUS | Status: AC
  Filled 2016-09-27: qty 1

## 2016-09-27 MED ORDER — ONDANSETRON HCL 4 MG/2ML IJ SOLN
INTRAMUSCULAR | Status: AC
  Filled 2016-09-27: qty 2

## 2016-09-27 MED ORDER — MENTHOL 3 MG MT LOZG
1.0000 | LOZENGE | OROMUCOSAL | Status: DC | PRN
  Administered 2016-09-27 – 2016-09-28 (×3): 3 mg via ORAL
  Filled 2016-09-27: qty 9

## 2016-09-27 MED ORDER — HYDRALAZINE HCL 20 MG/ML IJ SOLN: 5.0000 mg | INTRAMUSCULAR | Status: DC | PRN

## 2016-09-27 MED ORDER — ESMOLOL HCL-SODIUM CHLORIDE 2000 MG/100ML IV SOLN
25.0000 ug/kg/min | INTRAVENOUS | Status: DC
  Filled 2016-09-27: qty 100

## 2016-09-27 MED ORDER — IBUPROFEN 400 MG PO TABS: 200.0000 mg | ORAL_TABLET | Freq: Two times a day (BID) | ORAL | Status: DC | PRN

## 2016-09-27 MED ORDER — ASPIRIN EC 81 MG PO TBEC
81.0000 mg | DELAYED_RELEASE_TABLET | Freq: Every day | ORAL | Status: DC
  Administered 2016-09-27 – 2016-09-28 (×2): 81 mg via ORAL
  Filled 2016-09-27 (×2): qty 1

## 2016-09-27 MED ORDER — MIDAZOLAM HCL 2 MG/2ML IJ SOLN
INTRAMUSCULAR | Status: DC | PRN
  Administered 2016-09-27: 2 mg via INTRAVENOUS

## 2016-09-27 MED ORDER — LIDOCAINE HCL (CARDIAC) 20 MG/ML IV SOLN
INTRAVENOUS | Status: DC | PRN
  Administered 2016-09-27: 50 mg via INTRAVENOUS

## 2016-09-27 MED ORDER — SODIUM CHLORIDE 0.9 % IV SOLN
INTRAVENOUS | Status: DC | PRN
  Administered 2016-09-27: 06:00:00 via INTRAVENOUS

## 2016-09-27 MED ORDER — DEXAMETHASONE SODIUM PHOSPHATE 10 MG/ML IJ SOLN
INTRAMUSCULAR | Status: DC | PRN
  Administered 2016-09-27: 4 mg via INTRAVENOUS

## 2016-09-27 MED ORDER — PROPOFOL 10 MG/ML IV BOLUS
INTRAVENOUS | Status: DC | PRN
  Administered 2016-09-27: 50 mg via INTRAVENOUS
  Administered 2016-09-27: 150 mg via INTRAVENOUS

## 2016-09-27 MED ORDER — ROCURONIUM BROMIDE 100 MG/10ML IV SOLN
INTRAVENOUS | Status: DC | PRN
  Administered 2016-09-27: 5 mg via INTRAVENOUS
  Administered 2016-09-27: 45 mg via INTRAVENOUS
  Administered 2016-09-27: 10 mg via INTRAVENOUS

## 2016-09-27 MED ORDER — FENTANYL CITRATE (PF) 100 MCG/2ML IJ SOLN
25.0000 ug | INTRAMUSCULAR | Status: DC | PRN
  Administered 2016-09-27 (×4): 25 ug via INTRAVENOUS

## 2016-09-27 MED ORDER — ONDANSETRON HCL 4 MG/2ML IJ SOLN: 4.0000 mg | Freq: Once | INTRAMUSCULAR | Status: DC | PRN

## 2016-09-27 MED ORDER — ALUM & MAG HYDROXIDE-SIMETH 200-200-20 MG/5ML PO SUSP
15.0000 mL | ORAL | Status: DC | PRN
  Filled 2016-09-27: qty 30

## 2016-09-27 MED ORDER — SUCCINYLCHOLINE CHLORIDE 20 MG/ML IJ SOLN
INTRAMUSCULAR | Status: DC | PRN
Start: 2016-09-27 — End: 2016-09-27
  Administered 2016-09-27: 100 mg via INTRAVENOUS

## 2016-09-27 MED ORDER — SODIUM CHLORIDE 0.9 % IV SOLN
INTRAVENOUS | Status: DC | PRN
  Administered 2016-09-27: 20 ug/min via INTRAVENOUS

## 2016-09-27 MED ORDER — LIDOCAINE HCL (PF) 1 % IJ SOLN
INTRAMUSCULAR | Status: AC
  Filled 2016-09-27: qty 30

## 2016-09-27 MED ORDER — MIDAZOLAM HCL 2 MG/2ML IJ SOLN
INTRAMUSCULAR | Status: AC
  Filled 2016-09-27: qty 2

## 2016-09-27 MED ORDER — EVICEL 2 ML EX KIT
PACK | CUTANEOUS | Status: DC | PRN
  Administered 2016-09-27: 2 mL

## 2016-09-27 MED ORDER — ACETAMINOPHEN 650 MG RE SUPP: 325.0000 mg | RECTAL | Status: DC | PRN

## 2016-09-27 MED ORDER — LABETALOL HCL 5 MG/ML IV SOLN: 10.0000 mg | INTRAVENOUS | Status: DC | PRN

## 2016-09-27 MED ORDER — PHENYLEPHRINE HCL 10 MG/ML IJ SOLN
INTRAMUSCULAR | Status: AC
  Filled 2016-09-27: qty 1

## 2016-09-27 MED ORDER — NITROGLYCERIN IN D5W 200-5 MCG/ML-% IV SOLN
INTRAVENOUS | Status: AC
  Filled 2016-09-27: qty 250

## 2016-09-27 MED ORDER — LOSARTAN POTASSIUM 50 MG PO TABS
100.0000 mg | ORAL_TABLET | Freq: Every day | ORAL | Status: DC
  Administered 2016-09-27 – 2016-09-28 (×2): 100 mg via ORAL
  Filled 2016-09-27 (×2): qty 2

## 2016-09-27 MED ORDER — FENTANYL CITRATE (PF) 100 MCG/2ML IJ SOLN
INTRAMUSCULAR | Status: DC | PRN
  Administered 2016-09-27: 50 ug via INTRAVENOUS
  Administered 2016-09-27: 100 ug via INTRAVENOUS

## 2016-09-27 MED ORDER — ARTIFICIAL TEARS OPHTHALMIC OINT
TOPICAL_OINTMENT | Freq: Three times a day (TID) | OPHTHALMIC | Status: DC
  Administered 2016-09-27: 21:00:00 via OPHTHALMIC
  Administered 2016-09-27: 1 via OPHTHALMIC
  Administered 2016-09-28: 07:00:00 via OPHTHALMIC
  Filled 2016-09-27: qty 3.5

## 2016-09-27 MED ORDER — SUGAMMADEX SODIUM 200 MG/2ML IV SOLN
INTRAVENOUS | Status: AC
  Filled 2016-09-27: qty 2

## 2016-09-27 MED ORDER — ROSUVASTATIN CALCIUM 10 MG PO TABS
5.0000 mg | ORAL_TABLET | Freq: Every evening | ORAL | Status: DC
  Administered 2016-09-27: 5 mg via ORAL
  Filled 2016-09-27: qty 1

## 2016-09-27 MED ORDER — HEPARIN SODIUM (PORCINE) 10000 UNIT/ML IJ SOLN
INTRAMUSCULAR | Status: AC
  Filled 2016-09-27: qty 1

## 2016-09-27 MED ORDER — SEVOFLURANE IN SOLN
RESPIRATORY_TRACT | Status: AC
  Filled 2016-09-27: qty 250

## 2016-09-27 MED ORDER — CEFAZOLIN SODIUM-DEXTROSE 2-4 GM/100ML-% IV SOLN
INTRAVENOUS | Status: AC
  Filled 2016-09-27: qty 100

## 2016-09-27 MED ORDER — HEPARIN SODIUM (PORCINE) 1000 UNIT/ML IJ SOLN
INTRAMUSCULAR | Status: DC | PRN
Start: 2016-09-27 — End: 2016-09-27
  Administered 2016-09-27: 6000 [IU] via INTRAVENOUS

## 2016-09-27 MED ORDER — SUGAMMADEX SODIUM 200 MG/2ML IV SOLN
INTRAVENOUS | Status: DC | PRN
  Administered 2016-09-27: 170 mg via INTRAVENOUS

## 2016-09-27 SURGICAL SUPPLY — 62 items
BAG DECANTER FOR FLEXI CONT (MISCELLANEOUS) ×3 IMPLANT
BLADE SURG 15 STRL LF DISP TIS (BLADE) ×1 IMPLANT
BLADE SURG 15 STRL SS (BLADE) ×2
BLADE SURG SZ11 CARB STEEL (BLADE) ×3 IMPLANT
BOOT SUTURE AID YELLOW STND (SUTURE) ×3 IMPLANT
BRUSH SCRUB EZ  4% CHG (MISCELLANEOUS) ×2
BRUSH SCRUB EZ 4% CHG (MISCELLANEOUS) ×1 IMPLANT
CANISTER SUCT 1200ML W/VALVE (MISCELLANEOUS) ×3 IMPLANT
DERMABOND ADVANCED (GAUZE/BANDAGES/DRESSINGS) ×2
DERMABOND ADVANCED .7 DNX12 (GAUZE/BANDAGES/DRESSINGS) ×1 IMPLANT
DRAPE INCISE IOBAN 66X45 STRL (DRAPES) ×3 IMPLANT
DRAPE LAPAROTOMY 77X122 PED (DRAPES) ×3 IMPLANT
DRAPE SHEET LG 3/4 BI-LAMINATE (DRAPES) ×3 IMPLANT
DRSG TEGADERM 4X4.75 (GAUZE/BANDAGES/DRESSINGS) IMPLANT
DRSG TELFA 3X8 NADH (GAUZE/BANDAGES/DRESSINGS) IMPLANT
DURAPREP 26ML APPLICATOR (WOUND CARE) ×3 IMPLANT
ELECT CAUTERY BLADE 6.4 (BLADE) ×3 IMPLANT
ELECT REM PT RETURN 9FT ADLT (ELECTROSURGICAL) ×3
ELECTRODE REM PT RTRN 9FT ADLT (ELECTROSURGICAL) ×1 IMPLANT
GLOVE BIO SURGEON STRL SZ7 (GLOVE) ×9 IMPLANT
GLOVE INDICATOR 7.5 STRL GRN (GLOVE) ×3 IMPLANT
GOWN STRL REUS W/ TWL LRG LVL3 (GOWN DISPOSABLE) ×1 IMPLANT
GOWN STRL REUS W/ TWL XL LVL3 (GOWN DISPOSABLE) ×1 IMPLANT
GOWN STRL REUS W/TWL LRG LVL3 (GOWN DISPOSABLE) ×2
GOWN STRL REUS W/TWL XL LVL3 (GOWN DISPOSABLE) ×2
HEMOSTAT SURGICEL 2X3 (HEMOSTASIS) ×3 IMPLANT
IV NS 250ML (IV SOLUTION) ×2
IV NS 250ML BAXH (IV SOLUTION) ×1 IMPLANT
KIT RM TURNOVER STRD PROC AR (KITS) ×3 IMPLANT
LABEL OR SOLS (LABEL) ×3 IMPLANT
LOOP RED MAXI  1X406MM (MISCELLANEOUS) ×4
LOOP VESSEL MAXI 1X406 RED (MISCELLANEOUS) ×2 IMPLANT
LOOP VESSEL MINI 0.8X406 BLUE (MISCELLANEOUS) ×1 IMPLANT
LOOPS BLUE MINI 0.8X406MM (MISCELLANEOUS) ×2
NEEDLE FILTER BLUNT 18X 1/2SAF (NEEDLE) ×2
NEEDLE FILTER BLUNT 18X1 1/2 (NEEDLE) ×1 IMPLANT
NEEDLE HYPO 25X1 1.5 SAFETY (NEEDLE) ×3 IMPLANT
NS IRRIG 1000ML POUR BTL (IV SOLUTION) ×3 IMPLANT
PACK BASIN MAJOR ARMC (MISCELLANEOUS) ×3 IMPLANT
PATCH CAROTID ECM VASC 1X10 (Prosthesis & Implant Heart) ×3 IMPLANT
PENCIL ELECTRO HAND CTR (MISCELLANEOUS) IMPLANT
SHUNT CAROTID STR REINF 3.0X4. (MISCELLANEOUS) ×3 IMPLANT
SHUNT W TPORT 9FR PRUITT F3 (SHUNT) ×3 IMPLANT
SUT MNCRL 4-0 (SUTURE) ×2
SUT MNCRL 4-0 27XMFL (SUTURE) ×1
SUT PROLENE 6 0 BV (SUTURE) ×12 IMPLANT
SUT PROLENE 7 0 BV 1 (SUTURE) ×6 IMPLANT
SUT SILK 2 0 (SUTURE) ×2
SUT SILK 2-0 18XBRD TIE 12 (SUTURE) ×1 IMPLANT
SUT SILK 3 0 (SUTURE) ×2
SUT SILK 3-0 18XBRD TIE 12 (SUTURE) ×1 IMPLANT
SUT SILK 4 0 (SUTURE) ×2
SUT SILK 4-0 18XBRD TIE 12 (SUTURE) ×1 IMPLANT
SUT VIC AB 3-0 SH 27 (SUTURE) ×4
SUT VIC AB 3-0 SH 27X BRD (SUTURE) ×2 IMPLANT
SUTURE MNCRL 4-0 27XMF (SUTURE) ×1 IMPLANT
SYR 20CC LL (SYRINGE) ×3 IMPLANT
SYRINGE 10CC LL (SYRINGE) ×6 IMPLANT
TOWEL OR 17X26 4PK STRL BLUE (TOWEL DISPOSABLE) IMPLANT
TRAY FOLEY W/METER SILVER 16FR (SET/KITS/TRAYS/PACK) ×3 IMPLANT
TUBING CONNECTING 10 (TUBING) IMPLANT
TUBING CONNECTING 10' (TUBING)

## 2016-09-27 NOTE — Anesthesia Postprocedure Evaluation (Signed)
Anesthesia Post Note  Patient: Joel Ball.  Procedure(s) Performed: Procedure(s) (LRB): ENDARTERECTOMY CAROTID (Left)  Patient location during evaluation: PACU Anesthesia Type: General Level of consciousness: awake and alert Pain management: pain level controlled Vital Signs Assessment: post-procedure vital signs reviewed and stable Respiratory status: spontaneous breathing, nonlabored ventilation, respiratory function stable and patient connected to nasal cannula oxygen Cardiovascular status: blood pressure returned to baseline and stable Postop Assessment: no signs of nausea or vomiting Anesthetic complications: no     Last Vitals:  Vitals:   09/27/16 1039 09/27/16 1054  BP: 130/77 131/78  Pulse: 76 69  Resp: 12 19  Temp:  (!) 36.4 C  SpO2: 98% 100%    Last Pain:  Vitals:   09/27/16 1054  TempSrc:   PainSc: [name]

## 2016-09-27 NOTE — H&P (Signed)
[facility] History & Physical Update  The patient was interviewed and re-examined.  The patient's previous History and Physical has been reviewed and is unchanged.  There is no change in the plan of care. We plan to proceed with the scheduled procedure.  Leotis Pain, MD  09/27/2016, 7:24 AM

## 2016-09-27 NOTE — Progress Notes (Signed)
[facility]  Daily Progress Note   Subjective  - Day of Surgery  Doing well.  Sore throat and some pain. No major events   Objective Vitals:   09/27/16 1120 09/27/16 1200 09/27/16 1300 09/27/16 1400  BP: 126/70 133/79 126/80 127/68  Pulse: 92 88 91 99  Resp: 19 15 16 14   Temp:  98.1 F (36.7 C)    TempSrc:      SpO2: 97% 95% 96% 94%  Weight:      Height:        Intake/Output Summary (Last 24 hours) at 09/27/16 1716 Last data filed at 09/27/16 1054  Gross per 24 hour  Intake              800 ml  Output              385 ml  Net              415 ml    PULM  CTAB CV  RRR VASC  Neck with minimal swelling, neuro exam intact  Laboratory CBC No results found for: WBC, HGB, HCT, PLT  BMET    Component Value Date/Time   NA 142 09/20/2016 1138   K 4.6 09/20/2016 1138   CL 106 09/20/2016 1138   CO2 29 09/20/2016 1138   GLUCOSE 92 09/20/2016 1138   BUN 16 09/20/2016 1138   CREATININE 1.22 09/20/2016 1138   CALCIUM 9.5 09/20/2016 1138   GFRNONAA 59 (L) 09/20/2016 1138   GFRAA >60 09/20/2016 1138    Assessment/Planning: POD #0 s/p left CEA   Doing well  Advance diet as tolerated  Increase activity  Likely home tomorrow    Leotis Pain  09/27/2016, 5:16 PM

## 2016-09-27 NOTE — Anesthesia Preprocedure Evaluation (Addendum)
Anesthesia Evaluation  Patient identified by MRN, date of birth, ID band Patient awake    Reviewed: Allergy & Precautions, NPO status , Patient's Chart, lab work & pertinent test results, reviewed documented beta blocker date and time   Airway Mallampati: II  TM Distance: >3 FB     Dental  (+) Chipped   Pulmonary           Cardiovascular hypertension, Pt. on medications + Peripheral Vascular Disease       Neuro/Psych    GI/Hepatic GERD  Controlled,  Endo/Other    Renal/GU      Musculoskeletal   Abdominal   Peds  Hematology   Anesthesia Other Findings Allens test ok.  Reproductive/Obstetrics                          Anesthesia Physical Anesthesia Plan  ASA: III  Anesthesia Plan: General   Post-op Pain Management:    Induction: Intravenous  PONV Risk Score and Plan:   Airway Management Planned: Oral ETT  Additional Equipment:   Intra-op Plan:   Post-operative Plan:   Informed Consent: I have reviewed the patients History and Physical, chart, labs and discussed the procedure including the risks, benefits and alternatives for the proposed anesthesia with the patient or authorized representative who has indicated his/her understanding and acceptance.     Plan Discussed with: CRNA  Anesthesia Plan Comments:         Anesthesia Quick Evaluation

## 2016-09-27 NOTE — Transfer of Care (Signed)
Immediate Anesthesia Transfer of Care Note  Patient: Joel Ball.  Procedure(s) Performed: Procedure(s): ENDARTERECTOMY CAROTID (Left)  Patient Location: PACU  Anesthesia Type:General  Level of Consciousness: awake and responds to stimulation  Airway & Oxygen Therapy: Patient Spontanous Breathing and Patient connected to face mask oxygen  Post-op Assessment: Report given to RN and Post -op Vital signs reviewed and stable  Post vital signs: Reviewed and stable  Last Vitals:  Vitals:   09/27/16 0613 09/27/16 1009  BP: 122/73 140/79  Pulse: 67 82  Resp: 16 13  Temp: 36.6 C   SpO2: 100% 100%    Last Pain:  Vitals:   09/27/16 0613  TempSrc: Tympanic         Complications: No apparent anesthesia complications

## 2016-09-27 NOTE — Op Note (Signed)
[facility]   OPERATIVE NOTE  PROCEDURE:   1.  Left carotid endarterectomy with CorMatrix arterial patch reconstruction  PRE-OPERATIVE DIAGNOSIS: 1.  High grade left carotid stenosis   POST-OPERATIVE DIAGNOSIS: same as above   SURGEON: Leotis Pain, MD  ASSISTANT(S): none  ANESTHESIA: general  ESTIMATED BLOOD LOSS: 300 cc  FINDING(S): 1.  Left carotid plaque.  SPECIMEN(S):  Carotid plaque (sent to Pathology)  INDICATIONS:   Joel Ball. is a 66 y.o. male who presents with left carotid stenosis of >80%.  I discussed with the patient the risks, benefits, and alternatives to carotid endarterectomy.  I discussed the differences between carotid stenting and carotid endarterectomy. I discussed the procedural details of carotid endarterectomy with the patient.  The patient is aware that the risks of carotid endarterectomy include but are not limited to: bleeding, infection, stroke, myocardial infarction, death, cranial nerve injuries both temporary and permanent, neck hematoma, possible airway compromise, labile blood pressure post-operatively, cerebral hyperperfusion syndrome, and possible need for additional interventions in the future. The patient is aware of the risks and agrees to proceed forward with the procedure.  DESCRIPTION: After full informed written consent was obtained from the patient, the patient was brought back to the operating room and placed supine upon the operating table.  Prior to induction, the patient received IV antibiotics.  After obtaining adequate anesthesia, the patient was placed into a modified beach chair position with a shoulder roll in place and the patient's neck slightly hyperextended and rotated away from the surgical site.  The patient was prepped in the standard fashion for a carotid endarterectomy.  I made an incision anterior to the sternocleidomastoid muscle and dissected down through the subcutaneous tissue.  The platysmas  was opened with electrocautery.  Then I dissected down to the internal jugular vein and facial vein.  The facial vein is ligated and divided between 2-0 silk ties.  This was dissected posteriorly until I obtained visualization of the common carotid artery.  This was dissected out and then a vessel loop was placed around the common carotid artery.  I then dissected in a periadventitial fashion along the common carotid artery up to the bifurcation.  I then identified the external carotid artery and the superior thyroid artery.  I placed a vessel loop around the superior thyroid artery, and I also dissected out the external carotid artery and placed a vessel loop around it. In the process of this dissection, the hypoglossal nerve was identified and protected from harm.  I then dissected out the internal carotid artery until I identified an area in the internal carotid artery clearly above the stenosis. This was a tedious dissection and the hypoglossal nerve was difficult to dissect around. I dissected slightly distal to this area, and placed a vessel loop around the artery.  At this point, we gave the patient 6000 units of intravenous heparin.  After this was allowed to circulate for several minutes, I pulled up control on the vessel loops to clamp the internal carotid artery, external carotid artery, superior thyroid artery, and then the common carotid artery.  I then made an arteriotomy in the common carotid artery with a 11 blade, and extended the arteriotomy with a Potts scissor down into the common carotid artery, then I carried the arteriotomy through the bifurcation into the internal carotid artery until I reached an area that was not diseased. The P[place] shunt did not easily pass distally so this was abandoned.  At this  point, I took the [place] shunt that previously been prepared and I inserted it into the internal carotid artery first, and then into the common carotid artery taking care to flush and  de-air prior to release of control. At this point, I started the endarterectomy in the common carotid artery with a [place] elevator and carried this dissection down into the common carotid artery circumferentially.  Then I transected the plaque at a segment where it was adherent and transected the plaque with Potts scissors.  I then carried this dissection up into the external carotid artery.  The plaque was extracted by unclamping the external carotid artery and performing an eversion endarterectomy.  The dissection was then carried into the internal carotid artery where a nice feathered end point was created with gentle traction.  I passed the plaque off the field as a specimen. At this point I removed all loose flecks and remaining disease possible.  At this point, I was satisfied that the minimal remaining disease was densely adherent to the wall and wall integrity was intact. The distal endpoint was tacked down with two 7-0 Prolene sutures.  I then fashioned a CorMatrix arterial patch for the artery and sewed it in place with two running stitch of 6-0 Prolene.  I started at the distal endpoint and ran one half the length of the arteriotomy.  I then cut and beveled the patch to an appropriate length to match the arteriotomy.  I started the second 6-0 Prolene at the proximal end point.  The medial suture line was completed and the lateral suture line was run approximately one quarter the length of the arteriotomy.  Prior to completing this patch angioplasty, I removed the shunt first from the internal carotid artery, from which there was excellent backbleeding, and clamped it.  Then I removed the shunt from the common carotid artery, from which there was excellent antegrade bleeding, and then clamped it.  At this point, I allowed the external carotid artery to backbleed, which was excellent.  Then I instilled heparinized saline in this patched artery and then completed the patch angioplasty in the usual  fashion.  First, I released the clamp on the external carotid artery, then I released it on the common carotid artery.  After waiting a few seconds, I then released it on the internal carotid artery. Several minutes of pressure were held and 6-0 Prolene patch sutures were used as need for hemostasis.  At this point, I placed Surgicel and Evicel topical hemostatic agents.  There was no more active bleeding in the surgical site.  The sternocleidomastoid space was closed with three interrupted 3-0 Vicryl sutures. I then reapproximated the platysma muscle with a running stitch of 3-0 Vicryl.  The skin was then closed with a running subcuticular 4-0 Monocryl.  The skin was then cleaned, dried and Dermabond was used to reinforce the skin closure.  The patient awakened and was taken to the recovery room in stable condition, following commands and moving all four extremities without any apparent deficits.    COMPLICATIONS: none  CONDITION: stable  Leotis Pain  09/27/2016, 10:08 AM    This note was created with Dragon Medical transcription system. Any errors in dictation are purely unintentional.

## 2016-09-27 NOTE — Anesthesia Procedure Notes (Addendum)
Procedure Name: Intubation Performed by: Lance Muss Pre-anesthesia Checklist: Patient identified, Patient being monitored, Timeout performed, Emergency Drugs available and Suction available Patient Re-evaluated:Patient Re-evaluated prior to induction Oxygen Delivery Method: Circle system utilized Preoxygenation: Pre-oxygenation with 100% oxygen Induction Type: IV induction Ventilation: Mask ventilation without difficulty Laryngoscope Size: Mac and 3 Grade View: Grade II Tube type: Oral Tube size: 7.5 mm Number of attempts: 1 Airway Equipment and Method: Stylet and LTA kit utilized Placement Confirmation: ETT inserted through vocal cords under direct vision,  positive ETCO2 and breath sounds checked- equal and bilateral Secured at: 21 cm Tube secured with: Tape Dental Injury: Teeth and Oropharynx as per pre-operative assessment

## 2016-09-27 NOTE — Anesthesia Procedure Notes (Signed)
Arterial Line Insertion Start/End8/29/2018 7:47 AM, 09/27/2016 7:51 AM Performed by: Lance Muss, CRNA  Preanesthetic checklist: patient identified, IV checked, site marked, risks and benefits discussed, surgical consent, monitors and equipment checked, pre-op evaluation, timeout performed and anesthesia consent Right, radial was placed Catheter size: 20 G Hand hygiene performed   Attempts: 1 Following insertion, dressing applied and Biopatch. Patient tolerated the procedure well with no immediate complications.

## 2016-09-27 NOTE — Anesthesia Post-op Follow-up Note (Signed)
Anesthesia QCDR form completed.        

## 2016-09-27 NOTE — OR Nursing (Signed)
6000 units of Heparin given by anesthesia @ [phone]

## 2016-09-27 NOTE — OR Nursing (Signed)
Lab tech in for abo/rh draw @ [phone]

## 2016-09-28 LAB — CBC
HCT: 38.6 % — ABNORMAL LOW (ref 40.0–52.0)
HEMOGLOBIN: 13.2 g/dL (ref 13.0–18.0)
MCH: 31.7 pg (ref 26.0–34.0)
MCHC: 34.1 g/dL (ref 32.0–36.0)
MCV: 92.7 fL (ref 80.0–100.0)
Platelets: 195 10*3/uL (ref 150–440)
RBC: 4.16 MIL/uL — ABNORMAL LOW (ref 4.40–5.90)
RDW: 13.6 % (ref 11.5–14.5)
WBC: 9.6 10*3/uL (ref 3.8–10.6)

## 2016-09-28 LAB — BASIC METABOLIC PANEL
ANION GAP: 7 (ref 5–15)
BUN: 14 mg/dL (ref 6–20)
CALCIUM: 8.5 mg/dL — AB (ref 8.9–10.3)
CHLORIDE: 107 mmol/L (ref 101–111)
CO2: 23 mmol/L (ref 22–32)
CREATININE: 1.15 mg/dL (ref 0.61–1.24)
GFR calc non Af Amer: >60 mL/min (ref >60)
Glucose, Bld: 152 mg/dL — ABNORMAL HIGH (ref 65–99)
Potassium: 3.7 mmol/L (ref 3.5–5.1)
SODIUM: 137 mmol/L (ref 135–145)

## 2016-09-28 LAB — SURGICAL PATHOLOGY

## 2016-09-28 MED ORDER — HYDROCODONE-ACETAMINOPHEN 5-325 MG PO TABS: 1.0000 | ORAL_TABLET | Freq: Four times a day (QID) | ORAL | 0 refills | Status: DC | PRN

## 2016-09-28 MED ORDER — CLOPIDOGREL BISULFATE 75 MG PO TABS: 75.0000 mg | ORAL_TABLET | Freq: Every day | ORAL | 3 refills | Status: DC

## 2016-09-28 NOTE — Progress Notes (Signed)
Patient complained earlier in the shift about neck pain and was medicated at 2200 for pain. Asked for throat lozenger and wanted another one within 2 hours. Explained to patient that it was too soon to have a second. Pt seems slow to understand at times.  Arterial line and Foley removed. Tolerated.

## 2016-09-28 NOTE — Progress Notes (Signed)
Patient able to ambulate around the unit x2 without any difficulty. Complains of only soreness at surgical site. Family at bedside. Discharge education provided to patient and family. Patient to be discharged home with wife. Joel Ball

## 2016-09-28 NOTE — Discharge Summary (Signed)
[facility]    Discharge Summary    Patient ID:  Joel Ball. MRN: [id number] DOB/AGE: 10/12/1950 66 y.o.  Admit date: 09/27/2016 Discharge date: 09/28/2016 Date of Surgery: 09/27/2016 Surgeon: Surgeon(s): Algernon Huxley, MD  Admission Diagnosis: carotid stenosis  Discharge Diagnoses:  carotid stenosis  Secondary Diagnoses: Past Medical History:  Diagnosis Date  . GERD (gastroesophageal reflux disease)   . Hypertension   . Tinnitus     Procedure(s): ENDARTERECTOMY CAROTID  Discharged Condition: good  HPI:  Patient with visual symptoms and high grade left carotid stenosis.  Here for CEA  Hospital Course:  [facility] [place]. is a 66 y.o. male is S/P Left Procedure(s): ENDARTERECTOMY CAROTID Extubated: POD # 0 Physical exam: neck with minimal swelling, neuro exam intact Post-op wounds clean, dry, intact or healing well Pt. Ambulating, voiding and taking PO diet without difficulty. Pt pain controlled with PO pain meds. Labs as below Complications:none  Consults:    Significant Diagnostic Studies: CBC No results found for: WBC, HGB, HCT, MCV, PLT  BMET    Component Value Date/Time   NA 142 09/20/2016 1138   K 4.6 09/20/2016 1138   CL 106 09/20/2016 1138   CO2 29 09/20/2016 1138   GLUCOSE 92 09/20/2016 1138   BUN 16 09/20/2016 1138   CREATININE 1.22 09/20/2016 1138   CALCIUM 9.5 09/20/2016 1138   GFRNONAA 59 (L) 09/20/2016 1138   GFRAA >60 09/20/2016 1138   COAG Lab Results  Component Value Date   INR 0.97 09/20/2016     Disposition:  Discharge to :Home Discharge Instructions    Call MD for:  redness, tenderness, or signs of infection (pain, swelling, bleeding, redness, odor or green/yellow discharge around incision site)    Complete by:  As directed    Call MD for:  severe or increased pain, loss or decreased feeling  in affected limb(s)    Complete by:  As directed    Call MD for:  temperature >100.5    Complete  by:  As directed    Driving Restrictions    Complete by:  As directed    No driving for one week   Lifting restrictions    Complete by:  As directed    No lifting for 48 hours   No dressing needed    Complete by:  As directed    Replace only if drainage present   Resume previous diet    Complete by:  As directed      Allergies as of 09/28/2016      Reactions   Statins    In high doses causes muscle pain      Medication List    TAKE these medications   aspirin EC 81 MG tablet Take 81 mg by mouth daily. What changed:  Another medication with the same name was removed. Continue taking this medication, and follow the directions you see here.   aspirin-acetaminophen-caffeine 250-250-65 MG tablet Commonly known as:  EXCEDRIN MIGRAINE Take 1 tablet by mouth daily as needed for headache.   clopidogrel 75 MG tablet Commonly known as:  PLAVIX Take 1 tablet (75 mg total) by mouth daily with breakfast.   HYDROcodone-acetaminophen 5-325 MG tablet Commonly known as:  NORCO Take 1 tablet by mouth every 6 (six) hours as needed for moderate pain.   ibuprofen 200 MG tablet Commonly known as:  ADVIL,MOTRIN Take 200 mg by mouth 2 (two) times daily as needed for moderate pain.  losartan 100 MG tablet Commonly known as:  COZAAR Take 100 mg by mouth daily.   naproxen sodium 220 MG tablet Commonly known as:  ANAPROX Take 220 mg by mouth 2 (two) times daily as needed (pain).   omeprazole 20 MG capsule Commonly known as:  PRILOSEC Take 20 mg by mouth every Monday, Wednesday, and Friday.   PREPARATION H EX Apply 1 application topically daily as needed (hemorrhoid flare).   rosuvastatin 5 MG tablet Commonly known as:  CRESTOR Take 5 mg by mouth every evening.            Discharge Care Instructions        Start     Ordered   09/28/16 0000  clopidogrel (PLAVIX) 75 MG tablet  Daily with breakfast     09/28/16 0827   09/28/16 0000  HYDROcodone-acetaminophen (NORCO) 5-325 MG  tablet  Every 6 hours PRN     09/28/16 0827   09/28/16 0000  Resume previous diet     09/28/16 0827   09/28/16 0000  Driving Restrictions    Comments:  No driving for one week   09/28/16 0827   09/28/16 0000  Lifting restrictions    Comments:  No lifting for 48 hours   09/28/16 0827   09/28/16 0000  No dressing needed    Comments:  Replace only if drainage present   09/28/16 0827   09/28/16 0000  Call MD for:  temperature >100.5     09/28/16 0827   09/28/16 0000  Call MD for:  redness, tenderness, or signs of infection (pain, swelling, bleeding, redness, odor or green/yellow discharge around incision site)     09/28/16 0827   09/28/16 0000  Call MD for:  severe or increased pain, loss or decreased feeling  in affected limb(s)     09/28/16 0827     Verbal and written Discharge instructions given to the patient. Wound care per Discharge AVS Follow-up Information    Stegmayer, Joelene Millin A, PA-C Follow up in 3 week(s).   Specialty:  Physician Assistant Contact information: [place] [address] [phone]           Signed: Leotis Pain, MD  09/28/2016, 8:28 AM

## 2016-10-12 ENCOUNTER — Telehealth (INDEPENDENT_AMBULATORY_CARE_PROVIDER_SITE_OTHER): Payer: Self-pay

## 2016-10-12 NOTE — Telephone Encounter (Signed)
Patient called wanting to know if he could do activities now since his surgery was 09/27/16 ( [facility] ). Per Hezzie Bump PA he can do all activities in moderation.

## 2016-10-25 ENCOUNTER — Ambulatory Visit (INDEPENDENT_AMBULATORY_CARE_PROVIDER_SITE_OTHER): Payer: Medicare Other | Admitting: Vascular Surgery

## 2016-10-25 ENCOUNTER — Encounter (INDEPENDENT_AMBULATORY_CARE_PROVIDER_SITE_OTHER): Payer: Self-pay | Admitting: Vascular Surgery

## 2016-10-25 VITALS — BP 119/76 | HR 69 | Resp 16 | Wt 182.0 lb

## 2016-10-25 DIAGNOSIS — E785 Hyperlipidemia, unspecified: Secondary | ICD-10-CM

## 2016-10-25 DIAGNOSIS — I10 Essential (primary) hypertension: Secondary | ICD-10-CM

## 2016-10-25 DIAGNOSIS — I6523 Occlusion and stenosis of bilateral carotid arteries: Secondary | ICD-10-CM

## 2016-10-25 NOTE — Progress Notes (Signed)
Subjective:    Patient ID: Joel Red., male    DOB: 1950-05-08, 66 y.o.   MRN: [id number] Chief Complaint  Patient presents with  . Routine Post Op   Presents for his first postoperative follow-up. He is status post a left carotid endarterectomy on 09/27/2016. He presents today without complaint. His postoperative course has been unremarkable. His incision is healing well. The patient denies experiencing Amaurosis Fugax, TIA like symptoms or focal motor deficits.    Review of Systems  Constitutional: Negative.   HENT: Negative.   Eyes: Negative.   Respiratory: Negative.   Cardiovascular: Negative.   Gastrointestinal: Negative.   Endocrine: Negative.   Genitourinary: Negative.   Musculoskeletal: Negative.   Skin: Negative.   Allergic/Immunologic: Negative.   Neurological: Negative.   Hematological: Negative.   Psychiatric/Behavioral: Negative.       Objective:   Physical Exam  Constitutional: He is oriented to person, place, and time. He appears well-developed and well-nourished. No distress.  HENT:  Head: Normocephalic and atraumatic.  Eyes: Pupils are equal, round, and reactive to light. Conjunctivae are normal.  Neck: Normal range of motion.  Incision is healing well. No carotid bruits noted.  Cardiovascular: Normal rate, regular rhythm, normal heart sounds and intact distal pulses.   Pulses:      Radial pulses are 2+ on the right side, and 2+ on the left side.  Pulmonary/Chest: Effort normal.  Musculoskeletal: Normal range of motion. He exhibits no edema.  Neurological: He is alert and oriented to person, place, and time.  Skin: Skin is warm and dry. He is not diaphoretic.  Psychiatric: He has a normal mood and affect. His behavior is normal. Judgment and thought content normal.  Vitals reviewed.   BP 119/76 (BP Location: Right Arm)   Pulse 69   Resp 16   Wt 182 lb (82.6 kg)   BMI 27.67 kg/m   Past Medical History:  Diagnosis Date  . GERD  (gastroesophageal reflux disease)   . Hypertension   . Tinnitus     Social History   Social History  . Marital status: Married    Spouse name: N/A  . Number of children: N/A  . Years of education: N/A   Occupational History  . Not on file.   Social History Main Topics  . Smoking status: Never Smoker  . Smokeless tobacco: Never Used  . Alcohol use 9.0 oz/week    15 Shots of liquor per week     Comment: 2-3 oz of alcohol per day  . Drug use: No  . Sexual activity: Not on file   Other Topics Concern  . Not on file   Social History Narrative  . No narrative on file    Past Surgical History:  Procedure Laterality Date  . COLONOSCOPY WITH ESOPHAGOGASTRODUODENOSCOPY (EGD)    . COLONOSCOPY WITH PROPOFOL N/A 05/30/2016   Procedure: COLONOSCOPY WITH PROPOFOL;  Surgeon: Lollie Sails, MD;  Location: [facility] ENDOSCOPY;  Service: Endoscopy;  Laterality: N/A;  . ENDARTERECTOMY Left 09/27/2016   Procedure: ENDARTERECTOMY CAROTID;  Surgeon: Algernon Huxley, MD;  Location: [facility] ORS;  Service: Vascular;  Laterality: Left;    Family History  Problem Relation Age of Onset  . CAD Neg Hx     Allergies  Allergen Reactions  . Statins     In high doses causes muscle pain       Assessment & Plan:  Presents for his first postoperative follow-up. He is status post a left  carotid endarterectomy on 09/27/2016. He presents today without complaint. His postoperative course has been unremarkable. His incision is healing well. The patient denies experiencing Amaurosis Fugax, TIA like symptoms or focal motor deficits.   1. Bilateral carotid artery stenosis s/p left endarterectomy - New Patient is doing well postoperatively. He is without complaint today. Physical exam is unremarkable. Incision is healing well. Patient to follow up in 1 month for his first post operative ultrasound. Okay for patient to drive.  - VAS US CAROTID; Future  2. Essential hypertension - Stable Encouraged good  control as its slows the progression of atherosclerotic disease  3. Hyperlipidemia, unspecified hyperlipidemia type - Stable Encouraged good control as its slows the progression of atherosclerotic disease  Current Outpatient Prescriptions on File Prior to Visit  Medication Sig Dispense Refill  . aspirin EC 81 MG tablet Take 81 mg by mouth daily.    Marland Kitchen aspirin-acetaminophen-caffeine (EXCEDRIN MIGRAINE) 250-250-65 MG tablet Take 1 tablet by mouth daily as needed for headache.    . clopidogrel (PLAVIX) 75 MG tablet Take 1 tablet (75 mg total) by mouth daily with breakfast. 30 tablet 3  . HYDROcodone-acetaminophen (NORCO) 5-325 MG tablet Take 1 tablet by mouth every 6 (six) hours as needed for moderate pain. 30 tablet 0  . ibuprofen (ADVIL,MOTRIN) 200 MG tablet Take 200 mg by mouth 2 (two) times daily as needed for moderate pain.    Marland Kitchen losartan (COZAAR) 100 MG tablet Take 100 mg by mouth daily.    . naproxen sodium (ANAPROX) 220 MG tablet Take 220 mg by mouth 2 (two) times daily as needed (pain).    Marland Kitchen omeprazole (PRILOSEC) 20 MG capsule Take 20 mg by mouth every Monday, Wednesday, and Friday.     . rosuvastatin (CRESTOR) 5 MG tablet Take 5 mg by mouth every evening.     Addison Lank Hazel (PREPARATION H EX) Apply 1 application topically daily as needed (hemorrhoid flare).     No current facility-administered medications on file prior to visit.     There are no Patient Instructions on file for this visit. No Follow-up on file.   Roxy Filler A Johara Lodwick, PA-C

## 2016-11-23 ENCOUNTER — Encounter (INDEPENDENT_AMBULATORY_CARE_PROVIDER_SITE_OTHER): Payer: Self-pay | Admitting: Vascular Surgery

## 2016-11-23 ENCOUNTER — Ambulatory Visit (INDEPENDENT_AMBULATORY_CARE_PROVIDER_SITE_OTHER): Payer: Medicare Other | Admitting: Vascular Surgery

## 2016-11-23 ENCOUNTER — Ambulatory Visit (INDEPENDENT_AMBULATORY_CARE_PROVIDER_SITE_OTHER): Payer: Medicare Other

## 2016-11-23 VITALS — BP 107/61 | HR 70 | Resp 17 | Ht 68.0 in | Wt 183.0 lb

## 2016-11-23 DIAGNOSIS — K219 Gastro-esophageal reflux disease without esophagitis: Secondary | ICD-10-CM

## 2016-11-23 DIAGNOSIS — E785 Hyperlipidemia, unspecified: Secondary | ICD-10-CM

## 2016-11-23 DIAGNOSIS — I6523 Occlusion and stenosis of bilateral carotid arteries: Secondary | ICD-10-CM | POA: Diagnosis not present

## 2016-11-23 DIAGNOSIS — I10 Essential (primary) hypertension: Secondary | ICD-10-CM

## 2016-11-24 DIAGNOSIS — K219 Gastro-esophageal reflux disease without esophagitis: Secondary | ICD-10-CM | POA: Insufficient documentation

## 2016-11-24 NOTE — Progress Notes (Signed)
MRN : [id number]  Joel Aber. is a 66 y.o. (10-03-1950) male who presents with chief complaint of  Chief Complaint  Patient presents with  . Follow-up    1 mo. carotid  .  History of Present Illness: The patient is seen for follow up evaluation of carotid stenosis. The carotid stenosis followed by ultrasound.   The patient denies amaurosis fugax. There is no recent history of TIA symptoms or focal motor deficits. There is no prior documented CVA.  The patient is taking enteric-coated aspirin 81 mg daily.  There is no history of migraine headaches. There is no history of seizures.  The patient has a history of coronary artery disease, no recent episodes of angina or shortness of breath. The patient denies PAD or claudication symptoms. There is a history of hyperlipidemia which is being treated with a statin.    Carotid Duplex done today shows no change compared to last study   No outpatient prescriptions have been marked as taking for the 11/23/16 encounter (Office Visit) with Delana Meyer, Dolores Lory, MD.    Past Medical History:  Diagnosis Date  . GERD (gastroesophageal reflux disease)   . Hypertension   . Tinnitus     Past Surgical History:  Procedure Laterality Date  . COLONOSCOPY WITH ESOPHAGOGASTRODUODENOSCOPY (EGD)    . COLONOSCOPY WITH PROPOFOL N/A 05/30/2016   Procedure: COLONOSCOPY WITH PROPOFOL;  Surgeon: Lollie Sails, MD;  Location: [facility] ENDOSCOPY;  Service: Endoscopy;  Laterality: N/A;  . ENDARTERECTOMY Left 09/27/2016   Procedure: ENDARTERECTOMY CAROTID;  Surgeon: Algernon Huxley, MD;  Location: [facility] ORS;  Service: Vascular;  Laterality: Left;    Social History Social History  Substance Use Topics  . Smoking status: Never Smoker  . Smokeless tobacco: Never Used  . Alcohol use 9.0 oz/week    15 Shots of liquor per week     Comment: 2-3 oz of alcohol per day    Family History Family History  Problem Relation Age of Onset  . CAD Neg Hx      Allergies  Allergen Reactions  . Statins     In high doses causes muscle pain     REVIEW OF SYSTEMS (Negative unless checked)  Constitutional: [] Weight loss  [] Fever  [] Chills Cardiac: [] Chest pain   [] Chest pressure   [] Palpitations   [] Shortness of breath when laying flat   [] Shortness of breath with exertion. Vascular:  [] Pain in legs with walking   [] Pain in legs at rest  [] History of DVT   [] Phlebitis   [] Swelling in legs   [] Varicose veins   [] Non-healing ulcers Pulmonary:   [] Uses home oxygen   [] Productive cough   [] Hemoptysis   [] Wheeze  [] COPD   [] Asthma Neurologic:  [] Dizziness   [] Seizures   [] History of stroke   [] History of TIA  [] Aphasia   [] Vissual changes   [] Weakness or numbness in arm   [] Weakness or numbness in leg Musculoskeletal:   [] Joint swelling   [] Joint pain   [] Low back pain Hematologic:  [] Easy bruising  [] Easy bleeding   [] Hypercoagulable state   [] Anemic Gastrointestinal:  [] Diarrhea   [] Vomiting  [] Gastroesophageal reflux/heartburn   [] Difficulty swallowing. Genitourinary:  [] Chronic kidney disease   [] Difficult urination  [] Frequent urination   [] Blood in urine Skin:  [] Rashes   [] Ulcers  Psychological:  [] History of anxiety   []  History of major depression.  Physical Examination  Vitals:   11/23/16 1607 11/23/16 1608  BP: 105/60 107/61  Pulse: 74  70  Resp: 17   Weight: 83 kg (183 lb)   Height: 5\' 8"  (1.727 m)    Body mass index is 27.83 kg/m. Gen: WD/WN, NAD Head: [place]/AT, No temporalis wasting.  Ear/Nose/Throat: Hearing grossly intact, nares w/o erythema or drainage Eyes: PER, EOMI, sclera nonicteric.  Neck: Supple, no large masses.   Pulmonary:  Good air movement, no audible wheezing bilaterally, no use of accessory muscles.  Cardiac: RRR, no JVD Vascular: Well-healed left carotid endarterectomy incisional scar.  Bilateral carotid bruits auscultated Vessel Right Left  Radial Palpable Palpable  Ulnar Palpable Palpable  Brachial  Palpable Palpable  Carotid Palpable Palpable  Gastrointestinal: Non-distended. No guarding/no peritoneal signs.  Musculoskeletal: M/S 5/5 throughout.  No deformity or atrophy.  Neurologic: CN 2-12 intact. Symmetrical.  Speech is fluent. Motor exam as listed above. Psychiatric: Judgment intact, Mood & affect appropriate for pt's clinical situation. Dermatologic: No rashes or ulcers noted.  No changes consistent with cellulitis. Lymph : No lichenification or skin changes of chronic lymphedema.  CBC Lab Results  Component Value Date   WBC 9.6 09/28/2016   HGB 13.2 09/28/2016   HCT 38.6 (L) 09/28/2016   MCV 92.7 09/28/2016   PLT 195 09/28/2016    BMET    Component Value Date/Time   NA 137 09/28/2016 0849   K 3.7 09/28/2016 0849   CL 107 09/28/2016 0849   CO2 23 09/28/2016 0849   GLUCOSE 152 (H) 09/28/2016 0849   BUN 14 09/28/2016 0849   CREATININE 1.15 09/28/2016 0849   CALCIUM 8.5 (L) 09/28/2016 0849   GFRNONAA >60 09/28/2016 0849   GFRAA >60 09/28/2016 0849   CrCl cannot be calculated (Patient's most recent lab result is older than the maximum 21 days allowed.).  COAG Lab Results  Component Value Date   INR 0.97 09/20/2016    Radiology No results found.   Assessment/Plan 1. Bilateral carotid artery stenosis Recommend:  Given the patient's asymptomatic subcritical stenosis no further invasive testing or surgery at this time.  Duplex ultrasound shows less than 75% stenosis bilaterally.  Continue antiplatelet therapy as prescribed Continue management of CAD, HTN and Hyperlipidemia Healthy heart diet,  encouraged exercise at least 4 times per week Follow up in 12 months with duplex ultrasound and physical exam based on stable carotid artery stenosis and successful endarterectomy  - VAS US CAROTID; Future  2. Essential hypertension Continue antihypertensive medications as already ordered, these medications have been reviewed and there are no changes at this  time.   3. Hyperlipidemia, unspecified hyperlipidemia type Continue statin as ordered and reviewed, no changes at this time   4. Gastroesophageal reflux disease without esophagitis Continue PPI as already ordered, these medications have been reviewed and there are no changes at this time.     Hortencia Pilar, MD  11/24/2016 8:04 AM

## 2017-06-01 ENCOUNTER — Ambulatory Visit (INDEPENDENT_AMBULATORY_CARE_PROVIDER_SITE_OTHER): Payer: Medicare Other

## 2017-06-01 ENCOUNTER — Ambulatory Visit (INDEPENDENT_AMBULATORY_CARE_PROVIDER_SITE_OTHER): Payer: Medicare Other | Admitting: Vascular Surgery

## 2017-06-01 ENCOUNTER — Encounter (INDEPENDENT_AMBULATORY_CARE_PROVIDER_SITE_OTHER): Payer: Self-pay | Admitting: Vascular Surgery

## 2017-06-01 VITALS — BP 130/81 | HR 57 | Resp 16 | Ht 68.0 in | Wt 184.6 lb

## 2017-06-01 DIAGNOSIS — I6523 Occlusion and stenosis of bilateral carotid arteries: Secondary | ICD-10-CM

## 2017-06-01 DIAGNOSIS — I10 Essential (primary) hypertension: Secondary | ICD-10-CM | POA: Diagnosis not present

## 2017-06-01 DIAGNOSIS — E785 Hyperlipidemia, unspecified: Secondary | ICD-10-CM | POA: Diagnosis not present

## 2017-06-01 NOTE — Progress Notes (Signed)
MRN : [id number]  Joel Ball. is a 67 y.o. (1950/07/05) male who presents with chief complaint of  Chief Complaint  Patient presents with  . Carotid    6month follow up  .  History of Present Illness: Patient returns in follow-up of his carotid disease.  He is about 8 to 9 months status post left carotid endarterectomy for high-grade stenosis.  He is doing well.  He has no new focal neurologic symptoms. Specifically, the patient denies amaurosis fugax, speech or swallowing difficulties, or arm or leg weakness or numbness His carotid duplex today shows a widely patent left carotid endarterectomy without recurrent stenosis.  His right carotid stenosis falls just into the 60 to 79% range although his velocities are minimally increased from what they were 6 months ago.  Current Outpatient Medications  Medication Sig Dispense Refill  . aspirin EC 81 MG tablet Take 81 mg by mouth daily.    Marland Kitchen aspirin-acetaminophen-caffeine (EXCEDRIN MIGRAINE) 250-250-65 MG tablet Take 1 tablet by mouth daily as needed for headache.    . ibuprofen (ADVIL,MOTRIN) 200 MG tablet Take 200 mg by mouth 2 (two) times daily as needed for moderate pain.    Marland Kitchen losartan (COZAAR) 100 MG tablet Take 100 mg by mouth daily.    . naproxen sodium (ANAPROX) 220 MG tablet Take 220 mg by mouth 2 (two) times daily as needed (pain).    Marland Kitchen omeprazole (PRILOSEC) 20 MG capsule Take 20 mg by mouth every Monday, Wednesday, and Friday.     . rosuvastatin (CRESTOR) 5 MG tablet Take 5 mg by mouth every evening.     Addison Lank Hazel (PREPARATION H EX) Apply 1 application topically daily as needed (hemorrhoid flare).    . clopidogrel (PLAVIX) 75 MG tablet Take 1 tablet (75 mg total) by mouth daily with breakfast. (Patient not taking: Reported on 06/01/2017) 30 tablet 3  . HYDROcodone-acetaminophen (NORCO) 5-325 MG tablet Take 1 tablet by mouth every 6 (six) hours as needed for moderate pain. (Patient not taking: Reported on 06/01/2017) 30 tablet 0     No current facility-administered medications for this visit.     Past Medical History:  Diagnosis Date  . GERD (gastroesophageal reflux disease)   . Hypertension   . Tinnitus     Past Surgical History:  Procedure Laterality Date  . COLONOSCOPY WITH ESOPHAGOGASTRODUODENOSCOPY (EGD)    . COLONOSCOPY WITH PROPOFOL N/A 05/30/2016   Procedure: COLONOSCOPY WITH PROPOFOL;  Surgeon: Lollie Sails, MD;  Location: [facility] ENDOSCOPY;  Service: Endoscopy;  Laterality: N/A;  . ENDARTERECTOMY Left 09/27/2016   Procedure: ENDARTERECTOMY CAROTID;  Surgeon: Algernon Huxley, MD;  Location: [facility] ORS;  Service: Vascular;  Laterality: Left;    Social History Social History   Tobacco Use  . Smoking status: Never Smoker  . Smokeless tobacco: Never Used  Substance Use Topics  . Alcohol use: Yes    Alcohol/week: 9.0 oz    Types: 15 Shots of liquor per week    Comment: 2-3 oz of alcohol per day  . Drug use: No      Family History Family History  Problem Relation Age of Onset  . CAD Neg Hx      Allergies  Allergen Reactions  . Statins     In high doses causes muscle pain      REVIEW OF SYSTEMS (Negative unless checked)  Constitutional: [] Weight loss  [] Fever  [] Chills Cardiac: [] Chest pain   [] Chest pressure   [] Palpitations   []   Shortness of breath when laying flat   [] Shortness of breath at rest   [] Shortness of breath with exertion. Vascular:  [] Pain in legs with walking   [] Pain in legs at rest   [] Pain in legs when laying flat   [] Claudication   [] Pain in feet when walking  [] Pain in feet at rest  [] Pain in feet when laying flat   [] History of DVT   [] Phlebitis   [] Swelling in legs   [] Varicose veins   [] Non-healing ulcers Pulmonary:   [] Uses home oxygen   [] Productive cough   [] Hemoptysis   [] Wheeze  [] COPD   [] Asthma Neurologic:  [] Dizziness  [] Blackouts   [] Seizures   [] History of stroke   [] History of TIA  [] Aphasia   [x] Temporary blindness   [] Dysphagia   [] Weakness or numbness  in arms   [] Weakness or numbness in legs Musculoskeletal:  [] Arthritis   [] Joint swelling   [] Joint pain   [] Low back pain Hematologic:  [] Easy bruising  [] Easy bleeding   [] Hypercoagulable state   [] Anemic  [] Hepatitis Gastrointestinal:  [] Blood in stool   [] Vomiting blood  [x] Gastroesophageal reflux/heartburn   [] Abdominal pain Genitourinary:  [x] Chronic kidney disease   [] Difficult urination  [] Frequent urination  [] Burning with urination   [] Hematuria Skin:  [] Rashes   [] Ulcers   [] Wounds Psychological:  [] History of anxiety   []  History of major depression.    Physical Examination  Vitals:   06/01/17 1055 06/01/17 1056  BP: 125/75 130/81  Pulse: (!) 57 (!) 57  Resp: 16   Weight: 83.7 kg (184 lb 9.6 oz)   Height: 5\' 8"  (1.727 m)    Body mass index is 28.07 kg/m. Gen:  WD/WN, NAD Head: [place]/AT, No temporalis wasting. Ear/Nose/Throat: Hearing grossly intact, nares w/o erythema or drainage, trachea midline Eyes: Conjunctiva clear. Sclera non-icteric Neck: Supple.  Soft right carotid bruit  Pulmonary:  Good air movement, equal and clear to auscultation bilaterally.  Cardiac: RRR, No JVD Vascular:  Vessel Right Left  Radial Palpable Palpable                                    Musculoskeletal: M/S 5/5 throughout.  No deformity or atrophy.  No edema. Neurologic: CN 2-12 intact. Sensation grossly intact in extremities.  Symmetrical.  Speech is fluent. Motor exam as listed above. Psychiatric: Judgment intact, Mood & affect appropriate for pt's clinical situation. Dermatologic: No rashes or ulcers noted.  No cellulitis or open wounds.  Well-healed left CEA scar      CBC Lab Results  Component Value Date   WBC 9.6 09/28/2016   HGB 13.2 09/28/2016   HCT 38.6 (L) 09/28/2016   MCV 92.7 09/28/2016   PLT 195 09/28/2016    BMET    Component Value Date/Time   NA 137 09/28/2016 0849   K 3.7 09/28/2016 0849   CL 107 09/28/2016 0849   CO2 23 09/28/2016 0849   GLUCOSE  152 (H) 09/28/2016 0849   BUN 14 09/28/2016 0849   CREATININE 1.15 09/28/2016 0849   CALCIUM 8.5 (L) 09/28/2016 0849   GFRNONAA >60 09/28/2016 0849   GFRAA >60 09/28/2016 0849   CrCl cannot be calculated (Patient's most recent lab result is older than the maximum 21 days allowed.).  COAG Lab Results  Component Value Date   INR 0.97 09/20/2016    Radiology No results found.   Assessment/Plan Hypertension blood pressure control important in  reducing the progression of atherosclerotic disease. On appropriate oral medications.   Hyperlipidemia lipid control important in reducing the progression of atherosclerotic disease. Continue statin therapy   Carotid artery stenosis His carotid duplex today shows a widely patent left carotid endarterectomy without recurrent stenosis.  His right carotid stenosis falls just into the 60 to 79% range although his velocities are minimally increased from what they were 6 months ago. Below the threshold for repair.  Continue aspirin and a statin agent.  Recheck in 6 months.    Leotis Pain, MD  06/01/2017 11:54 AM    This note was created with Dragon medical transcription system.  Any errors from dictation are purely unintentional

## 2017-06-01 NOTE — Assessment & Plan Note (Signed)
His carotid duplex today shows a widely patent left carotid endarterectomy without recurrent stenosis.  His right carotid stenosis falls just into the 60 to 79% range although his velocities are minimally increased from what they were 6 months ago. Below the threshold for repair.  Continue aspirin and a statin agent.  Recheck in 6 months.

## 2017-06-01 NOTE — Assessment & Plan Note (Signed)
lipid control important in reducing the progression of atherosclerotic disease. Continue statin therapy  

## 2017-06-01 NOTE — Patient Instructions (Signed)
Carotid Artery Disease The carotid arteries are arteries on both sides of the neck. They carry blood to the brain. Carotid artery disease is when the arteries get smaller (narrow) or get blocked. If these arteries get smaller or get blocked, you are more likely to have a stroke or warning stroke (transient ischemic attack). Follow these instructions at home:  Take medicines as told by your doctor. Make sure you understand all your medicine instructions. Do not stop your medicines without talking to your doctor first.  Follow your doctor's diet instructions. It is important to eat a healthy diet that includes plenty of: ? Fresh fruits. ? Vegetables. ? Lean meats.  Avoid: ? High-fat foods. ? High-sodium foods. ? Foods that are fried, overly processed, or have poor nutritional value.  Stay a healthy weight.  Stay active. Get at least 30 minutes of activity every day.  Do not smoke.  Limit alcohol use to: ? No more than 2 drinks a day for men. ? No more than 1 drink a day for women who are not pregnant.  Do not use illegal drugs.  Keep all doctor visits as told. Get help right away if:  You have sudden weakness or loss of feeling (numbness) on one side of the body, such as the face, arm, or leg.  You have sudden confusion.  You have trouble speaking (aphasia) or understanding.  You have sudden trouble seeing out of one or both eyes.  You have sudden trouble walking.  You have dizziness or feel like you might pass out (faint).  You have a loss of balance or your movements are not steady (uncoordinated).  You have a sudden, severe headache with no known cause.  You have trouble swallowing (dysphagia). Call your local emergency services (911 in [place] Do notdrive yourself to the clinic or hospital. This information is not intended to replace advice given to you by your health care provider. Make sure you discuss any questions you have with your health care  provider. Document Released: 01/03/2012 Document Revised: 06/24/2015 Document Reviewed: 07/17/2012 Elsevier Interactive Patient Education  2018 Elsevier Inc.  

## 2017-12-03 ENCOUNTER — Other Ambulatory Visit (INDEPENDENT_AMBULATORY_CARE_PROVIDER_SITE_OTHER): Payer: Self-pay | Admitting: Vascular Surgery

## 2017-12-03 DIAGNOSIS — I779 Disorder of arteries and arterioles, unspecified: Secondary | ICD-10-CM

## 2017-12-03 DIAGNOSIS — Z9889 Other specified postprocedural states: Secondary | ICD-10-CM

## 2017-12-03 DIAGNOSIS — I739 Peripheral vascular disease, unspecified: Principal | ICD-10-CM

## 2017-12-04 ENCOUNTER — Ambulatory Visit (INDEPENDENT_AMBULATORY_CARE_PROVIDER_SITE_OTHER): Payer: Medicare Other

## 2017-12-04 ENCOUNTER — Encounter (INDEPENDENT_AMBULATORY_CARE_PROVIDER_SITE_OTHER): Payer: Self-pay | Admitting: Vascular Surgery

## 2017-12-04 ENCOUNTER — Ambulatory Visit (INDEPENDENT_AMBULATORY_CARE_PROVIDER_SITE_OTHER): Payer: Medicare Other | Admitting: Vascular Surgery

## 2017-12-04 VITALS — BP 124/79 | HR 62 | Resp 18 | Ht 68.0 in | Wt 188.0 lb

## 2017-12-04 DIAGNOSIS — Z9889 Other specified postprocedural states: Secondary | ICD-10-CM | POA: Diagnosis not present

## 2017-12-04 DIAGNOSIS — I10 Essential (primary) hypertension: Secondary | ICD-10-CM | POA: Diagnosis not present

## 2017-12-04 DIAGNOSIS — I739 Peripheral vascular disease, unspecified: Principal | ICD-10-CM

## 2017-12-04 DIAGNOSIS — E785 Hyperlipidemia, unspecified: Secondary | ICD-10-CM

## 2017-12-04 DIAGNOSIS — I6521 Occlusion and stenosis of right carotid artery: Secondary | ICD-10-CM | POA: Diagnosis not present

## 2017-12-04 DIAGNOSIS — I6523 Occlusion and stenosis of bilateral carotid arteries: Secondary | ICD-10-CM

## 2017-12-04 DIAGNOSIS — I779 Disorder of arteries and arterioles, unspecified: Secondary | ICD-10-CM

## 2017-12-04 NOTE — Progress Notes (Signed)
MRN : [id number]  Joel Ball. is a 67 y.o. (06-26-1950) male who presents with chief complaint of  Chief Complaint  Patient presents with  . Follow-up    6 month Carotid f/u  .  History of Present Illness: Patient returns in follow-up of his carotid disease.  He is doing well without focal neurologic symptoms. Specifically, the patient denies amaurosis fugax, speech or swallowing difficulties, or arm or leg weakness or numbness.  He is taking Crestor daily without side effects.  His duplex today shows his left carotid endarterectomy to be widely patent.  His right carotid artery stenosis remains on the lower end of the 60 to 79% right.  Current Outpatient Medications  Medication Sig Dispense Refill  . aspirin EC 81 MG tablet Take 81 mg by mouth daily.    Marland Kitchen losartan (COZAAR) 100 MG tablet Take 100 mg by mouth daily.    . magnesium oxide (MAG-OX) 400 MG tablet Take by mouth.    Marland Kitchen omeprazole (PRILOSEC) 20 MG capsule Take 20 mg by mouth every Monday, Wednesday, and Friday.     . rosuvastatin (CRESTOR) 5 MG tablet Take 5 mg by mouth every evening.     Addison Lank Hazel (PREPARATION H EX) Apply 1 application topically daily as needed (hemorrhoid flare).    Marland Kitchen aspirin-acetaminophen-caffeine (EXCEDRIN MIGRAINE) 250-250-65 MG tablet Take 1 tablet by mouth daily as needed for headache.    . clopidogrel (PLAVIX) 75 MG tablet Take 1 tablet (75 mg total) by mouth daily with breakfast. (Patient not taking: Reported on 06/01/2017) 30 tablet 3  . ibuprofen (ADVIL,MOTRIN) 200 MG tablet Take 200 mg by mouth 2 (two) times daily as needed for moderate pain.    . naproxen sodium (ANAPROX) 220 MG tablet Take 220 mg by mouth 2 (two) times daily as needed (pain).     No current facility-administered medications for this visit.     Past Medical History:  Diagnosis Date  . GERD (gastroesophageal reflux disease)   . Hypertension   . Tinnitus     Past Surgical History:  Procedure Laterality Date  .  COLONOSCOPY WITH ESOPHAGOGASTRODUODENOSCOPY (EGD)    . COLONOSCOPY WITH PROPOFOL N/A 05/30/2016   Procedure: COLONOSCOPY WITH PROPOFOL;  Surgeon: Lollie Sails, MD;  Location: [facility] ENDOSCOPY;  Service: Endoscopy;  Laterality: N/A;  . ENDARTERECTOMY Left 09/27/2016   Procedure: ENDARTERECTOMY CAROTID;  Surgeon: Algernon Huxley, MD;  Location: [facility] ORS;  Service: Vascular;  Laterality: Left;    Social History        Tobacco Use  . Smoking status: Never Smoker  . Smokeless tobacco: Never Used  Substance Use Topics  . Alcohol use: Yes    Alcohol/week: 9.0 oz    Types: 15 Shots of liquor per week    Comment: 2-3 oz of alcohol per day  . Drug use: No      Family History      Family History  Problem Relation Age of Onset  . CAD Neg Hx           Allergies  Allergen Reactions  . Statins     In high doses causes muscle pain      REVIEW OF SYSTEMS(Negative unless checked)  Constitutional: [] Weight loss[] Fever[] Chills Cardiac:[] Chest pain[] Chest pressure[] Palpitations [] Shortness of breath when laying flat [] Shortness of breath at rest [] Shortness of breath with exertion. Vascular: [] Pain in legs with walking[] Pain in legsat rest[] Pain in legs when laying flat [] Claudication [] Pain in feet when walking [] Pain in feet  at rest [] Pain in feet when laying flat [] History of DVT [] Phlebitis [] Swelling in legs [] Varicose veins [] Non-healing ulcers Pulmonary: [] Uses home oxygen [] Productive cough[] Hemoptysis [] Wheeze [] COPD [] Asthma Neurologic: [] Dizziness [] Blackouts [] Seizures [] History of stroke [] History of TIA[] Aphasia [x] Temporary blindness[] Dysphagia [] Weaknessor numbness in arms [] Weakness or numbnessin legs Musculoskeletal: [] Arthritis [] Joint swelling [] Joint pain [] Low back pain Hematologic:[] Easy bruising[] Easy bleeding [] Hypercoagulable state [] Anemic  [] Hepatitis Gastrointestinal:[] Blood in stool[] Vomiting blood[x] Gastroesophageal reflux/heartburn[] Abdominal pain Genitourinary: [x] Chronic kidney disease [] Difficulturination [] Frequenturination [] Burning with urination[] Hematuria Skin: [] Rashes [] Ulcers [] Wounds Psychological: [] History of anxiety[] History of major depression.    Physical Examination  Vitals:   12/04/17 1009 12/04/17 1010  BP: 126/79 124/79  Pulse: 62 62  Resp: 18   Weight: 188 lb (85.3 kg)   Height: 5\' 8"  (1.727 m)    Body mass index is 28.59 kg/m. Gen:  WD/WN, NAD Head: [place]/AT, No temporalis wasting. Ear/Nose/Throat: Hearing grossly intact, nares w/o erythema or drainage, trachea midline Eyes: Conjunctiva clear. Sclera non-icteric Neck: Supple.  Soft right carotid bruit  Pulmonary:  Good air movement, equal and clear to auscultation bilaterally.  Cardiac: RRR, No JVD Vascular:  Vessel Right Left  Radial Palpable Palpable                                   Gastrointestinal: soft, non-tender/non-distended. No guarding/reflex.  Musculoskeletal: M/S 5/5 throughout.  No deformity or atrophy. no edema. Neurologic: CN 2-12 intact. Sensation grossly intact in extremities.  Symmetrical.  Speech is fluent. Motor exam as listed above. Psychiatric: Judgment intact, Mood & affect appropriate for pt's clinical situation. Dermatologic: No rashes or ulcers noted.  No cellulitis or open wounds.      CBC Lab Results  Component Value Date   WBC 9.6 09/28/2016   HGB 13.2 09/28/2016   HCT 38.6 (L) 09/28/2016   MCV 92.7 09/28/2016   PLT 195 09/28/2016    BMET    Component Value Date/Time   NA 137 09/28/2016 0849   K 3.7 09/28/2016 0849   CL 107 09/28/2016 0849   CO2 23 09/28/2016 0849   GLUCOSE 152 (H) 09/28/2016 0849   BUN 14 09/28/2016 0849   CREATININE 1.15 09/28/2016 0849   CALCIUM 8.5 (L) 09/28/2016 0849   GFRNONAA >60 09/28/2016 0849   GFRAA >60 09/28/2016 0849    CrCl cannot be calculated (Patient's most recent lab result is older than the maximum 21 days allowed.).  COAG Lab Results  Component Value Date   INR 0.97 09/20/2016    Radiology No results found.    Assessment/Plan Hypertension blood pressure control important in reducing the progression of atherosclerotic disease. On appropriate oral medications.   Hyperlipidemia lipid control important in reducing the progression of atherosclerotic disease. Continue statin therapy  Carotid artery stenosis His duplex today shows his left carotid endarterectomy to be widely patent.  His right carotid artery stenosis remains on the lower end of the 60 to 79% right.  Recommend:  Given the patient's asymptomatic subcritical stenosis no further invasive testing or surgery at this time.  Continue antiplatelet therapy as prescribed Continue management of CAD, HTN and Hyperlipidemia Healthy heart diet,  encouraged exercise at least 4 times per week Follow up in 6 months with duplex ultrasound and physical exam     Leotis Pain, MD  12/04/2017 11:09 AM    This note was created with Dragon medical transcription system.  Any errors from dictation are purely unintentional

## 2017-12-04 NOTE — Assessment & Plan Note (Signed)
His duplex today shows his left carotid endarterectomy to be widely patent.  His right carotid artery stenosis remains on the lower end of the 60 to 79% right.  Recommend:  Given the patient's asymptomatic subcritical stenosis no further invasive testing or surgery at this time.  Continue antiplatelet therapy as prescribed Continue management of CAD, HTN and Hyperlipidemia Healthy heart diet,  encouraged exercise at least 4 times per week Follow up in 6 months with duplex ultrasound and physical exam

## 2018-01-16 ENCOUNTER — Telehealth (INDEPENDENT_AMBULATORY_CARE_PROVIDER_SITE_OTHER): Payer: Self-pay

## 2018-01-16 NOTE — Telephone Encounter (Signed)
Dr Janeann Forehand called and left a message on the triage line and stated that she needed to speak with Dr. Lucky Cowboy in regards to Mr. Sada. She stated that he was treated by Dr. Lucky Cowboy in 2018 for his left arm, and now he is having the same issue but in his left arm. She stated that he was sent to the emergency department to be monitored for stroke risk. Her call back number is [phone] she wants to be able to coordinate the plan for what to do for the patient.

## 2018-01-17 ENCOUNTER — Ambulatory Visit
Admission: RE | Admit: 2018-01-17 | Discharge: 2018-01-17 | Disposition: A | Discharge status: Home / Self Care | Payer: Medicare Other | Source: Ambulatory Visit | Attending: Nurse Practitioner | Admitting: Nurse Practitioner

## 2018-01-17 ENCOUNTER — Other Ambulatory Visit: Payer: Self-pay

## 2018-01-17 ENCOUNTER — Encounter (INDEPENDENT_AMBULATORY_CARE_PROVIDER_SITE_OTHER): Payer: Self-pay | Admitting: Nurse Practitioner

## 2018-01-17 ENCOUNTER — Ambulatory Visit (INDEPENDENT_AMBULATORY_CARE_PROVIDER_SITE_OTHER): Payer: Medicare Other | Admitting: Nurse Practitioner

## 2018-01-17 ENCOUNTER — Ambulatory Visit (INDEPENDENT_AMBULATORY_CARE_PROVIDER_SITE_OTHER): Payer: Medicare Other

## 2018-01-17 VITALS — BP 129/78 | HR 70 | Resp 17 | Ht 68.0 in | Wt 188.0 lb

## 2018-01-17 DIAGNOSIS — I6523 Occlusion and stenosis of bilateral carotid arteries: Secondary | ICD-10-CM | POA: Diagnosis not present

## 2018-01-17 DIAGNOSIS — I10 Essential (primary) hypertension: Secondary | ICD-10-CM

## 2018-01-17 DIAGNOSIS — K219 Gastro-esophageal reflux disease without esophagitis: Secondary | ICD-10-CM

## 2018-01-17 DIAGNOSIS — E785 Hyperlipidemia, unspecified: Secondary | ICD-10-CM

## 2018-01-17 DIAGNOSIS — I63239 Cerebral infarction due to unspecified occlusion or stenosis of unspecified carotid arteries: Secondary | ICD-10-CM

## 2018-01-17 LAB — POCT I-STAT CREATININE: CREATININE: 1.5 mg/dL — AB (ref 0.61–1.24)

## 2018-01-17 MED ORDER — IOPAMIDOL (ISOVUE-370) INJECTION 76%: 75.0000 mL | Freq: Once | INTRAVENOUS | Status: DC | PRN

## 2018-01-17 MED ORDER — IOPAMIDOL (ISOVUE-370) INJECTION 76%
60.0000 mL | Freq: Once | INTRAVENOUS | Status: AC | PRN
  Administered 2018-01-17: 60 mL via INTRAVENOUS

## 2018-01-17 NOTE — Progress Notes (Signed)
Subjective:    Patient ID: Joel Red., male    DOB: 08-13-1950, 67 y.o.   MRN: [id number] Chief Complaint  Patient presents with  . Follow-up    carotid ultrasound    HPI  Joel Sachse. is a 67 y.o. male that presents today due to concerns surrounding acute vision loss.  He states that approximately 2 days ago he suddenly had a change in vision in his right eye.  He states that this change in vision was similar to the change that he had in 2018 which led to his carotid endarterectomy.  He states that it feels as if a shade was suddenly pulled down over his right eye.  He subsequently went to his eye doctor which revealed a new branch retinal artery occlusion.  He denies any weakness of his upper or lower extremities.  He endorses symptoms that are consistent with amaurosis fugax.  He denies chest pain or shortness of breath.  He denies issues with balance or dizziness.  He denies any fever, chills, nausea, vomiting or diarrhea.  He denies any chest pain or palpitation.  Past Medical History:  Diagnosis Date  . GERD (gastroesophageal reflux disease)   . Hypertension   . Tinnitus     Past Surgical History:  Procedure Laterality Date  . COLONOSCOPY WITH ESOPHAGOGASTRODUODENOSCOPY (EGD)    . COLONOSCOPY WITH PROPOFOL N/A 05/30/2016   Procedure: COLONOSCOPY WITH PROPOFOL;  Surgeon: Lollie Sails, MD;  Location: [facility] ENDOSCOPY;  Service: Endoscopy;  Laterality: N/A;  . ENDARTERECTOMY Left 09/27/2016   Procedure: ENDARTERECTOMY CAROTID;  Surgeon: Algernon Huxley, MD;  Location: [facility] ORS;  Service: Vascular;  Laterality: Left;    Social History   Socioeconomic History  . Marital status: Married    Spouse name: Not on file  . Number of children: Not on file  . Years of education: Not on file  . Highest education level: Not on file  Occupational History  . Not on file  Social Needs  . Financial resource strain: Not on file  . Food insecurity:    Worry: Not on file   Inability: Not on file  . Transportation needs:    Medical: Not on file    Non-medical: Not on file  Tobacco Use  . Smoking status: Never Smoker  . Smokeless tobacco: Never Used  Substance and Sexual Activity  . Alcohol use: Yes    Alcohol/week: 15.0 standard drinks    Types: 15 Shots of liquor per week    Comment: 2-3 oz of alcohol per day  . Drug use: No  . Sexual activity: Not on file  Lifestyle  . Physical activity:    Days per week: Not on file    Minutes per session: Not on file  . Stress: Not on file  Relationships  . Social connections:    Talks on phone: Not on file    Gets together: Not on file    Attends religious service: Not on file    Active member of club or organization: Not on file    Attends meetings of clubs or organizations: Not on file    Relationship status: Not on file  . Intimate partner violence:    Fear of current or ex partner: Not on file    Emotionally abused: Not on file    Physically abused: Not on file    Forced sexual activity: Not on file  Other Topics Concern  . Not on file  Social  History Narrative  . Not on file    Family History  Problem Relation Age of Onset  . CAD Neg Hx     Allergies  Allergen Reactions  . Statins     In high doses causes muscle pain     Review of Systems   Review of Systems: Negative Unless Checked Constitutional: [] Weight loss  [] Fever  [] Chills Cardiac: [] Chest pain   []  Atrial Fibrillation  [] Palpitations   [] Shortness of breath when laying flat   [] Shortness of breath with exertion. [] Shortness of breath at rest Vascular:  [] Pain in legs with walking   [] Pain in legs with standing [] Pain in legs when laying flat   [] Claudication    [] Pain in feet when laying flat    [] History of DVT   [] Phlebitis   [] Swelling in legs   [] Varicose veins   [] Non-healing ulcers Pulmonary:   [] Uses home oxygen   [] Productive cough   [] Hemoptysis   [] Wheeze  [] COPD   [] Asthma Neurologic:  [] Dizziness   [x]  visual  changes [] Blackouts [x] History of stroke   [] History of TIA  [] Aphasia   [x] Temporary Blindness   [] Weakness or numbness in arm   [] Weakness or numbness in leg Musculoskeletal:   [] Joint swelling   [] Joint pain   [] Low back pain  []  History of Knee Replacement [] Arthritis [] back Surgeries  []  Spinal Stenosis    Hematologic:  [] Easy bruising  [] Easy bleeding   [] Hypercoagulable state   [] Anemic Gastrointestinal:  [] Diarrhea   [] Vomiting  [x] Gastroesophageal reflux/heartburn   [] Difficulty swallowing. [] Abdominal pain Genitourinary:  [] Chronic kidney disease   [] Difficult urination  [] Anuric   [] Blood in urine [] Frequent urination  [] Burning with urination   [] Hematuria Skin:  [] Rashes   [] Ulcers [] Wounds Psychological:  [] History of anxiety   []  History of major depression  []  Memory Difficulties     Objective:   Physical Exam  BP 129/78   Pulse 70   Resp 17   Ht 5\' 8"  (1.727 m)   Wt 188 lb (85.3 kg)   BMI 28.59 kg/m   Gen: WD/WN, NAD Head: [place]/AT, No temporalis wasting.  Ear/Nose/Throat: Hearing grossly intact, nares w/o erythema or drainage Eyes: PER, EOMI, sclera nonicteric.  Neck: Supple, no masses.  No JVD.  Soft right carotid bruit auscultated Pulmonary:  Good air movement, no use of accessory muscles.  Cardiac: RRR Vascular:  Vessel Right Left  Radial Palpable Palpable   Gastrointestinal: soft, non-distended. No guarding/no peritoneal signs.  Musculoskeletal: M/S 5/5 throughout.  No deformity or atrophy.  Neurologic: Pain and light touch intact in extremities.  Symmetrical.  Speech is fluent. Motor exam as listed above. Psychiatric: Judgment intact, Mood & affect appropriate for pt's clinical situation. Dermatologic: No Venous rashes. No Ulcers Noted.  No changes consistent with cellulitis. Lymph : No Cervical lymphadenopathy, no lichenification or skin changes of chronic lymphedema.      Assessment & Plan:   1. Symptomatic carotid artery stenosis with infarction  [facility]) Given the patient's symptomatic nature, which is reminiscent of his previous retinal emboli, I feel is prudent for [place] to go ahead and proceed with a CT Angie of the head and neck in order to assess the level of stenosis of his right carotid artery, plan for possible intervention, and determine if there are any other causes or consequences of the emboli.  If the patient does indeed need surgery cardiac clearance will be required, once cleared the patient will be scheduled for surgery.  The risks, benefits  and alternative therapies were reviewed in detail with the patient.  All questions were answered.  The patient agrees to proceed with imaging.  Continue antiplatelet therapy as prescribed. Continue management of CAD, HTN and Hyperlipidemia. Healthy heart diet, encouraged exercise at least 4 times per week.    - CT ANGIO HEAD W OR WO CONTRAST; Future - CT ANGIO NECK W OR WO CONTRAST; Future  2. Essential hypertension Continue antihypertensive medications as already ordered, these medications have been reviewed and there are no changes at this time.   3. Gastroesophageal reflux disease without esophagitis Continue PPI as already ordered, this medication has been reviewed and there are no changes at this time.  Avoidence of caffeine and alcohol  Moderate elevation of the head of the bed   4. Hyperlipidemia, unspecified hyperlipidemia type Continue statin as ordered and reviewed, no changes at this time    Current Outpatient Medications on File Prior to Visit  Medication Sig Dispense Refill  . aspirin EC 81 MG tablet Take 81 mg by mouth daily.    Marland Kitchen aspirin-acetaminophen-caffeine (EXCEDRIN MIGRAINE) 250-250-65 MG tablet Take 1 tablet by mouth daily as needed for headache.    . ibuprofen (ADVIL,MOTRIN) 200 MG tablet Take 200 mg by mouth 2 (two) times daily as needed for moderate pain.    Marland Kitchen losartan (COZAAR) 100 MG tablet Take 100 mg by mouth daily.    . naproxen sodium (ANAPROX)  220 MG tablet Take 220 mg by mouth 2 (two) times daily as needed (pain).    Marland Kitchen omeprazole (PRILOSEC) 20 MG capsule Take 20 mg by mouth every Monday, Wednesday, and Friday.     . rosuvastatin (CRESTOR) 5 MG tablet Take 5 mg by mouth every evening.     Addison Lank Hazel (PREPARATION H EX) Apply 1 application topically daily as needed (hemorrhoid flare).    . clopidogrel (PLAVIX) 75 MG tablet Take 1 tablet (75 mg total) by mouth daily with breakfast. (Patient not taking: Reported on 06/01/2017) 30 tablet 3  . magnesium oxide (MAG-OX) 400 MG tablet Take by mouth.     No current facility-administered medications on file prior to visit.     There are no Patient Instructions on file for this visit. No follow-ups on file.   Kris Hartmann, NP  This note was completed with Sales executive.  Any errors are purely unintentional.

## 2018-01-18 ENCOUNTER — Ambulatory Visit (INDEPENDENT_AMBULATORY_CARE_PROVIDER_SITE_OTHER): Payer: Medicare Other | Admitting: Vascular Surgery

## 2018-01-18 ENCOUNTER — Encounter (INDEPENDENT_AMBULATORY_CARE_PROVIDER_SITE_OTHER): Payer: Self-pay | Admitting: Vascular Surgery

## 2018-01-18 VITALS — BP 139/82 | HR 70 | Resp 14 | Ht 68.0 in | Wt 187.8 lb

## 2018-01-18 DIAGNOSIS — I63239 Cerebral infarction due to unspecified occlusion or stenosis of unspecified carotid arteries: Secondary | ICD-10-CM

## 2018-01-18 DIAGNOSIS — E785 Hyperlipidemia, unspecified: Secondary | ICD-10-CM | POA: Diagnosis not present

## 2018-01-18 DIAGNOSIS — I6523 Occlusion and stenosis of bilateral carotid arteries: Secondary | ICD-10-CM

## 2018-01-18 DIAGNOSIS — H34231 Retinal artery branch occlusion, right eye: Secondary | ICD-10-CM | POA: Diagnosis not present

## 2018-01-18 DIAGNOSIS — I10 Essential (primary) hypertension: Secondary | ICD-10-CM | POA: Diagnosis not present

## 2018-01-18 NOTE — Assessment & Plan Note (Signed)
This now represents a new and symptomatic issue.  This would be a reason to repair his carotid stenosis.

## 2018-01-18 NOTE — Progress Notes (Signed)
MRN : [id number]  Joel Ball. is a 67 y.o. (Jan 10, 1951) male who presents with chief complaint of  Chief Complaint  Patient presents with  . Follow-up  .  History of Present Illness: Patient returns in follow-up today after his CT angiogram which I have independently reviewed.  He still has some pain in his right as well as some visual field deficits but these are not any worse.  This was a stark change from his asymptomatic status about a month ago.  His CT angiogram is interpreted as at least a 70% blockage in the right internal carotid artery.  I think it actually looks a little bit worse than that.  His left carotid endarterectomy site is widely patent.  Current Outpatient Medications  Medication Sig Dispense Refill  . aspirin EC 81 MG tablet Take 81 mg by mouth daily.    Marland Kitchen aspirin-acetaminophen-caffeine (EXCEDRIN MIGRAINE) 250-250-65 MG tablet Take 1 tablet by mouth daily as needed for headache.    . clopidogrel (PLAVIX) 75 MG tablet Take 1 tablet (75 mg total) by mouth daily with breakfast. 30 tablet 3  . ibuprofen (ADVIL,MOTRIN) 200 MG tablet Take 200 mg by mouth 2 (two) times daily as needed for moderate pain.    Marland Kitchen losartan (COZAAR) 100 MG tablet Take 100 mg by mouth daily.    . magnesium oxide (MAG-OX) 400 MG tablet Take by mouth.    . naproxen sodium (ANAPROX) 220 MG tablet Take 220 mg by mouth 2 (two) times daily as needed (pain).    Marland Kitchen omeprazole (PRILOSEC) 20 MG capsule Take 20 mg by mouth every Monday, Wednesday, and Friday.     . rosuvastatin (CRESTOR) 5 MG tablet Take 5 mg by mouth every evening.     Addison Lank Hazel (PREPARATION H EX) Apply 1 application topically daily as needed (hemorrhoid flare).     No current facility-administered medications for this visit.     Past Medical History:  Diagnosis Date  . GERD (gastroesophageal reflux disease)   . Hypertension   . Tinnitus     Past Surgical History:  Procedure Laterality Date  . COLONOSCOPY WITH  ESOPHAGOGASTRODUODENOSCOPY (EGD)    . COLONOSCOPY WITH PROPOFOL N/A 05/30/2016   Procedure: COLONOSCOPY WITH PROPOFOL;  Surgeon: Lollie Sails, MD;  Location: [facility] ENDOSCOPY;  Service: Endoscopy;  Laterality: N/A;  . ENDARTERECTOMY Left 09/27/2016   Procedure: ENDARTERECTOMY CAROTID;  Surgeon: Algernon Huxley, MD;  Location: [facility] ORS;  Service: Vascular;  Laterality: Left;    Social History        Tobacco Use  . Smoking status: Never Smoker  . Smokeless tobacco: Never Used  Substance Use Topics  . Alcohol use: Yes    Alcohol/week: 9.0 oz    Types: 15 Shots of liquor per week    Comment: 2-3 oz of alcohol per day  . Drug use: No     Family History      Family History  Problem Relation Age of Onset  . CAD Neg Hx   No bleeding disorders, clotting disorders, autoimmune diseases, or aneurysms        Allergies  Allergen Reactions  . Statins     In high doses causes muscle pain      REVIEW OF SYSTEMS(Negative unless checked)  Constitutional: [] ?Weight loss[] ?Fever[] ?Chills Cardiac:[] ?Chest pain[] ?Chest pressure[] ?Palpitations [] ?Shortness of breath when laying flat [] ?Shortness of breath at rest [] ?Shortness of breath with exertion. Vascular: [] ?Pain in legs with walking[] ?Pain in legsat rest[] ?Pain in legs when laying  flat [] ?Claudication [] ?Pain in feet when walking [] ?Pain in feet at rest [] ?Pain in feet when laying flat [] ?History of DVT [] ?Phlebitis [] ?Swelling in legs [] ?Varicose veins [] ?Non-healing ulcers Pulmonary: [] ?Uses home oxygen [] ?Productive cough[] ?Hemoptysis [] ?Wheeze [] ?COPD [] ?Asthma Neurologic: [] ?Dizziness [] ?Blackouts [] ?Seizures [] ?History of stroke [] ?History of TIA[] ?Aphasia [x] ?Temporary blindness[] ?Dysphagia [] ?Weaknessor numbness in arms [] ?Weakness or numbnessin legs Musculoskeletal: [] ?Arthritis [] ?Joint swelling [] ?Joint pain [] ?Low  back pain Hematologic:[] ?Easy bruising[] ?Easy bleeding [] ?Hypercoagulable state [] ?Anemic [] ?Hepatitis Gastrointestinal:[] ?Blood in stool[] ?Vomiting blood[x] ?Gastroesophageal reflux/heartburn[] ?Abdominal pain Genitourinary: [x] ?Chronic kidney disease [] ?Difficulturination [] ?Frequenturination [] ?Burning with urination[] ?Hematuria Skin: [] ?Rashes [] ?Ulcers [] ?Wounds Psychological: [] ?History of anxiety[] ?History of major depression.    Physical Examination  Vitals:   01/18/18 1059  BP: 139/82  Pulse: 70  Resp: 14  Weight: 187 lb 12.8 oz (85.2 kg)  Height: 5\' 8"  (1.727 m)   Body mass index is 28.55 kg/m. Gen:  WD/WN, NAD.  Appears younger than stated age Head: [place]/AT, No temporalis wasting. Ear/Nose/Throat: Hearing grossly intact, nares w/o erythema or drainage, trachea midline Eyes: Conjunctiva clear. Sclera non-icteric Neck: Supple.  Right bruit  Pulmonary:  Good air movement, respirations not labored, no use of accessory muscles Cardiac: RRR, No JVD Vascular:  Vessel Right Left  Radial Palpable Palpable                                    Musculoskeletal: M/S 5/5 throughout.  No deformity or atrophy.  No edema. Neurologic: CN 2-12 intact. Sensation grossly intact in extremities.  Symmetrical.  Speech is fluent. Motor exam as listed above. Psychiatric: Judgment intact, Mood & affect appropriate for pt's clinical situation. Dermatologic: No rashes or ulcers noted.  No cellulitis or open wounds.      CBC Lab Results  Component Value Date   WBC 9.6 09/28/2016   HGB 13.2 09/28/2016   HCT 38.6 (L) 09/28/2016   MCV 92.7 09/28/2016   PLT 195 09/28/2016    BMET    Component Value Date/Time   NA 137 09/28/2016 0849   K 3.7 09/28/2016 0849   CL 107 09/28/2016 0849   CO2 23 09/28/2016 0849   GLUCOSE 152 (H) 09/28/2016 0849   BUN 14 09/28/2016 0849   CREATININE 1.50 (H) 01/17/2018 1417   CALCIUM 8.5 (L) 09/28/2016 0849    GFRNONAA >60 09/28/2016 0849   GFRAA >60 09/28/2016 0849   Estimated Creatinine Clearance: 49.4 mL/min (A) (by C-G formula based on SCr of 1.5 mg/dL (H)).  COAG Lab Results  Component Value Date   INR 0.97 09/20/2016    Radiology Ct Angio Head W Or Wo Contrast  Addendum Date: 01/17/2018   ADDENDUM REPORT: 01/17/2018 16:37 ADDENDUM: Study discussed by telephone with Provider Eulogio Ditch on 01/17/2018 at 1629 hours. Electronically Signed   By: Genevie Ann M.D.   On: 01/17/2018 16:37   Result Date: 01/17/2018 CLINICAL DATA:  67 year old male awoke with partial vision loss in his right eye 2 days ago. History of severe left and moderate right ICA stenosis on prior CTA. EXAM: CT ANGIOGRAPHY HEAD AND NECK TECHNIQUE: Multidetector CT imaging of the head and neck was performed using the standard protocol during bolus administration of intravenous contrast. Multiplanar CT image reconstructions and MIPs were obtained to evaluate the vascular anatomy. Carotid stenosis measurements (when applicable) are obtained utilizing NASCET criteria, using the distal internal carotid diameter as the denominator. CONTRAST:  25mL ISOVUE-370 IOPAMIDOL (ISOVUE-370) INJECTION 76% COMPARISON:  CTA neck 09/14/2016. FINDINGS: CT HEAD Brain: No acute  intracranial hemorrhage identified. No midline shift, mass effect, or evidence of intracranial mass lesion. No ventriculomegaly. Normal suprasellar cistern. Hypodensity in the left caudate head compatible with chronic lacunar infarct (series 5, image 10). No cortical encephalomalacia identified. Gray-white matter differentiation elsewhere within normal limits for age. Calvarium and skull base: Osteopenia, otherwise negative. Paranasal sinuses: Scattered mild paranasal sinus mucosal thickening. Tympanic cavities and mastoids are clear. Orbits: Symmetric and normal bilateral orbits soft tissues. Visualized scalp soft tissues are within normal limits. CTA NECK Skeleton: Supra new Marine  left maxillary tooth (series 18, image 48). Osteopenia. No acute osseous abnormality identified. Upper chest: Mild mosaic attenuation in the upper lungs compatible with gas trapping. Stable maximal right paratracheal lymph node. Only diminutive other visible mediastinal nodes. Other neck: Negative. Aortic arch: Good contrast timing. Three vessel arch configuration with no significant arch atherosclerosis. Right carotid system: Mildly tortuous proximal right CCA. Negative right CCA proximal to the bifurcation. At the right carotid bifurcation mostly soft plaque results in chronic narrowing of the vessel which has progressed, now numerically estimated at 70% at the distal bulb (about 10 millimeters beyond the origin series 17, image 174) where there may be a small adherent thrombus within the vessel (see image 175, also series 20 image 84). The vessel remains patent and has a normal caliber distally to the skull base. Left carotid system: Mildly tortuous proximal left CCA. Status post left carotid endarterectomy since the 2018 CTA with widely patent bifurcation and no adverse features. Vertebral arteries: Tortuous proximal right subclavian artery with a kinked appearance at the thoracic inlet. Normal right vertebral artery origin. Mild right V2 tortuosity. Patent right vertebral to the skull base without stenosis. Mild proximal left subclavian artery tortuosity. Normal left vertebral artery origin. Mild left V2 tortuosity. Patent left vertebral artery to the skull base without stenosis. CTA HEAD Posterior circulation: Codominant distal vertebral arteries are patent without stenosis. Patent PICA origins, vertebrobasilar junction. Patent basilar artery without stenosis. Normal left PCA origin with fetal type right PCA origin. Both posterior communicating arteries and P1 segments are present. Normal bilateral PCA branches. Anterior circulation: Both ICA siphons are patent without stenosis. Minimal bilateral siphon  atherosclerosis. Patent and normal bilateral ophthalmic artery origins. Normal posterior communicating artery origins. Normal carotid termini, MCA and ACA origins. Mildly dominant left ACA A1 segment. Anterior communicating artery and bilateral ACA branches are within normal limits. Left MCA M1 segment, trifurcation, and left MCA branches are within normal limits. Right MCA M1 segment, bifurcation, and right MCA branches are within normal limits. Venous sinuses: Patent. The right transverse and sigmoid sinus are dominant. Anatomic variants: Fetal type right PCA origin. Mildly dominant left ACA A1 segment. Delayed phase: No abnormal enhancement identified. Review of the MIP images confirms the above findings IMPRESSION: 1. Progressed since 2018 and Severe Right ICA stenosis (~70%) at the distal bulb where the appearance is also suspicious for a small clot adherent to the wall (series 20, image 84). 2. Status post left carotid endarterectomy with widely patent bifurcation and no adverse features. 3. Normal for age bilateral ICA siphons. Patent ophthalmic artery origins and otherwise negative intracranial CTA. 4. Small chronic appearing lacunar infarct in the left caudate. No acute intracranial abnormality by CT. Electronically Signed: By: Genevie Ann M.D. On: 01/17/2018 14:54   Ct Angio Neck W Or Wo Contrast  Addendum Date: 01/17/2018   ADDENDUM REPORT: 01/17/2018 16:37 ADDENDUM: Study discussed by telephone with Provider Eulogio Ditch on 01/17/2018 at 1629 hours. Electronically Signed  By: Genevie Ann M.D.   On: 01/17/2018 16:37   Result Date: 01/17/2018 CLINICAL DATA:  67 year old male awoke with partial vision loss in his right eye 2 days ago. History of severe left and moderate right ICA stenosis on prior CTA. EXAM: CT ANGIOGRAPHY HEAD AND NECK TECHNIQUE: Multidetector CT imaging of the head and neck was performed using the standard protocol during bolus administration of intravenous contrast. Multiplanar CT image  reconstructions and MIPs were obtained to evaluate the vascular anatomy. Carotid stenosis measurements (when applicable) are obtained utilizing NASCET criteria, using the distal internal carotid diameter as the denominator. CONTRAST:  46mL ISOVUE-370 IOPAMIDOL (ISOVUE-370) INJECTION 76% COMPARISON:  CTA neck 09/14/2016. FINDINGS: CT HEAD Brain: No acute intracranial hemorrhage identified. No midline shift, mass effect, or evidence of intracranial mass lesion. No ventriculomegaly. Normal suprasellar cistern. Hypodensity in the left caudate head compatible with chronic lacunar infarct (series 5, image 10). No cortical encephalomalacia identified. Gray-white matter differentiation elsewhere within normal limits for age. Calvarium and skull base: Osteopenia, otherwise negative. Paranasal sinuses: Scattered mild paranasal sinus mucosal thickening. Tympanic cavities and mastoids are clear. Orbits: Symmetric and normal bilateral orbits soft tissues. Visualized scalp soft tissues are within normal limits. CTA NECK Skeleton: Supra new Marine left maxillary tooth (series 18, image 48). Osteopenia. No acute osseous abnormality identified. Upper chest: Mild mosaic attenuation in the upper lungs compatible with gas trapping. Stable maximal right paratracheal lymph node. Only diminutive other visible mediastinal nodes. Other neck: Negative. Aortic arch: Good contrast timing. Three vessel arch configuration with no significant arch atherosclerosis. Right carotid system: Mildly tortuous proximal right CCA. Negative right CCA proximal to the bifurcation. At the right carotid bifurcation mostly soft plaque results in chronic narrowing of the vessel which has progressed, now numerically estimated at 70% at the distal bulb (about 10 millimeters beyond the origin series 17, image 174) where there may be a small adherent thrombus within the vessel (see image 175, also series 20 image 84). The vessel remains patent and has a normal  caliber distally to the skull base. Left carotid system: Mildly tortuous proximal left CCA. Status post left carotid endarterectomy since the 2018 CTA with widely patent bifurcation and no adverse features. Vertebral arteries: Tortuous proximal right subclavian artery with a kinked appearance at the thoracic inlet. Normal right vertebral artery origin. Mild right V2 tortuosity. Patent right vertebral to the skull base without stenosis. Mild proximal left subclavian artery tortuosity. Normal left vertebral artery origin. Mild left V2 tortuosity. Patent left vertebral artery to the skull base without stenosis. CTA HEAD Posterior circulation: Codominant distal vertebral arteries are patent without stenosis. Patent PICA origins, vertebrobasilar junction. Patent basilar artery without stenosis. Normal left PCA origin with fetal type right PCA origin. Both posterior communicating arteries and P1 segments are present. Normal bilateral PCA branches. Anterior circulation: Both ICA siphons are patent without stenosis. Minimal bilateral siphon atherosclerosis. Patent and normal bilateral ophthalmic artery origins. Normal posterior communicating artery origins. Normal carotid termini, MCA and ACA origins. Mildly dominant left ACA A1 segment. Anterior communicating artery and bilateral ACA branches are within normal limits. Left MCA M1 segment, trifurcation, and left MCA branches are within normal limits. Right MCA M1 segment, bifurcation, and right MCA branches are within normal limits. Venous sinuses: Patent. The right transverse and sigmoid sinus are dominant. Anatomic variants: Fetal type right PCA origin. Mildly dominant left ACA A1 segment. Delayed phase: No abnormal enhancement identified. Review of the MIP images confirms the above findings IMPRESSION: 1. Progressed since  2018 and Severe Right ICA stenosis (~70%) at the distal bulb where the appearance is also suspicious for a small clot adherent to the wall (series 20,  image 84). 2. Status post left carotid endarterectomy with widely patent bifurcation and no adverse features. 3. Normal for age bilateral ICA siphons. Patent ophthalmic artery origins and otherwise negative intracranial CTA. 4. Small chronic appearing lacunar infarct in the left caudate. No acute intracranial abnormality by CT. Electronically Signed: By: Genevie Ann M.D. On: 01/17/2018 14:54      Assessment/Plan Hypertension blood pressure control important in reducing the progression of atherosclerotic disease. On appropriate oral medications.   Hyperlipidemia lipid control important in reducing the progression of atherosclerotic disease. Continue statin therapy  Retinal artery occlusion, branch, right This now represents a new and symptomatic issue.  This would be a reason to repair his carotid stenosis.  Carotid artery stenosis His CT angiogram is interpreted as at least a 70% blockage in the right internal carotid artery.  I think it actually looks a little bit worse than that.  His left carotid endarterectomy site is widely patent. With a symptomatic high-grade lesion, surgery should be performed to reduce his stroke risk.  I believe his anatomy is more favorable for carotid endarterectomy of her carotid stenting.  I discussed the risks and benefits the procedures.  The patient is agreeable to proceed and this will be scheduled in the near future at his convenience.    Leotis Pain, MD  01/18/2018 11:55 AM    This note was created with Dragon medical transcription system.  Any errors from dictation are purely unintentional

## 2018-01-18 NOTE — Patient Instructions (Signed)
Carotid Endarterectomy, Care After  This sheet gives you information about how to care for yourself after your procedure. Your health care provider may also give you more specific instructions. If you have problems or questions, contact your health care provider.  What can I expect after the procedure?  After the procedure, it is common to have some pain or an ache in your neck for up to 2 weeks. This is normal.  Follow these instructions at home:  Incision care    · Follow instructions from your health care provider about how to take care of your incision. Make sure you:  ? Wash your hands with soap and water before you change your bandage (dressing). If soap and water are not available, use hand sanitizer.  ? Change your dressing as told by your health care provider.  ? Leave stitches (sutures), skin glue, or adhesive strips in place. These skin closures may need to stay in place for 2 weeks or longer. If adhesive strip edges start to loosen and curl up, you may trim the loose edges. Do not remove adhesive strips completely unless your health care provider tells you to do that.  · Check your incision area every day for signs of infection. Check for:  ? Redness, swelling, or pain.  ? Fluid or blood.  ? Warmth.  ? Pus or a bad smell.  · Do not take baths, swim, or use a hot tub until your health care provider approves. Ask your health care provider if you can take showers.  Medicines  · Take over-the-counter and prescription medicines only as told by your health care provider.  · If you get a blood thinner (anticoagulant) medicine after surgery, take it exactly as directed.  · Do not drive for 24 hours if you were given a sedative.  · Do not drive or use heavy machinery while taking prescription pain medicine.  Activity  · Do not lift anything that is heavier than 10 lb (4.5 kg), or the limit that your health care provider tells you, until he or she says that it is safe.  · Exercise regularly or as told by your  health care provider.  · Return to your normal activities as told by your health care provider. Ask your health care provider what activities are safe for you.  ? Recovery time varies depending on your age, general health, and other factors. You will likely be able to return to a normal lifestyle within a few weeks. While you recover, you may need help with some activities such as cleaning the house or shopping.  General instructions  · Do not use any products that contain nicotine or tobacco, such as cigarettes and e-cigarettes. If you need help quitting, ask your health care provider.  · Drink enough fluid to keep your urine clear or pale yellow.  · Take steps to control your blood pressure.  · Work to reach and maintain a healthy body weight.  · Eat a heart-healthy diet. Talk with your health care provider about how to lower blood lipids (cholesterol and triglycerides).  · Avoid wearing tight clothing around your neck and your stitches.  · Keep all follow-up visits as told by your health care provider. This is important.  Contact a health care provider if:  · You have signs of infection, such as:  ? Redness, swelling, or pain around your incision.  ? Fluid or blood coming from your incision.  ? Your incision feeling warm to the touch.  ?   Pus or a bad smell coming from your incision.  ? A fever.  · You develop a rash.  · You have difficulty speaking or you have changes in your voice.  Get help right away if:  · You have difficulty breathing.  · You have chest pain or shortness of breath.  · You suddenly develop any of these symptoms:  ? Weakness or numbness in your face, arm, or leg, especially on one side of your body.  ? Confusion.  ? Trouble speaking, understanding, or both.  ? Trouble seeing with one or both eyes.  ? A severe headache with no known cause.  · You have a seizure.  Summary  · After the procedure, it is common to have some pain or an ache in your neck for up to 2 weeks.  · Follow instructions from  your health care provider about how to take care of your incision.  · Keep all follow-up visits as told by your health care provider. This is important.  This information is not intended to replace advice given to you by your health care provider. Make sure you discuss any questions you have with your health care provider.  Document Released: 08/05/2004 Document Revised: 12/06/2015 Document Reviewed: 12/06/2015  Elsevier Interactive Patient Education © 2019 [employer]

## 2018-01-18 NOTE — Assessment & Plan Note (Signed)
His CT angiogram is interpreted as at least a 70% blockage in the right internal carotid artery.  I think it actually looks a little bit worse than that.  His left carotid endarterectomy site is widely patent. With a symptomatic high-grade lesion, surgery should be performed to reduce his stroke risk.  I believe his anatomy is more favorable for carotid endarterectomy of her carotid stenting.  I discussed the risks and benefits the procedures.  The patient is agreeable to proceed and this will be scheduled in the near future at his convenience.

## 2018-01-21 ENCOUNTER — Telehealth (INDEPENDENT_AMBULATORY_CARE_PROVIDER_SITE_OTHER): Payer: Self-pay

## 2018-01-21 NOTE — Telephone Encounter (Signed)
Spoke with the patient and discussed getting cardiac clearance and that he has an appt with Dr. Saralyn Pilar at [place] [facility] on 01/24/18 @ 9:45 am. I also discussed that after his clearance I will then get him scheduled to have surgery.

## 2018-01-29 ENCOUNTER — Encounter (INDEPENDENT_AMBULATORY_CARE_PROVIDER_SITE_OTHER): Payer: Self-pay

## 2018-01-29 NOTE — Telephone Encounter (Signed)
Spoke with the patient and let him know I have him scheduled for his surgery on 02/13/18 and his pre-op is scheduled at 02/07/18 @ 11:15 am. This information will also be mailed out to the patient.

## 2018-02-06 ENCOUNTER — Other Ambulatory Visit (INDEPENDENT_AMBULATORY_CARE_PROVIDER_SITE_OTHER): Payer: Self-pay | Admitting: Nurse Practitioner

## 2018-02-07 ENCOUNTER — Encounter
Admission: RE | Admit: 2018-02-07 | Discharge: 2018-02-07 | Disposition: A | Discharge status: Home / Self Care | Payer: Medicare Other | Source: Ambulatory Visit | Attending: Vascular Surgery | Admitting: Vascular Surgery

## 2018-02-07 ENCOUNTER — Other Ambulatory Visit: Payer: Self-pay

## 2018-02-07 DIAGNOSIS — Z01818 Encounter for other preprocedural examination: Secondary | ICD-10-CM | POA: Diagnosis present

## 2018-02-07 HISTORY — DX: Cerebral infarction, unspecified: I63.9

## 2018-02-07 LAB — TYPE AND SCREEN
ABO/RH(D): O POS
Antibody Screen: NEGATIVE

## 2018-02-07 LAB — APTT: aPTT: 31 seconds (ref 24–36)

## 2018-02-07 LAB — BASIC METABOLIC PANEL
Anion gap: 8 (ref 5–15)
BUN: 16 mg/dL (ref 8–23)
CO2: 24 mmol/L (ref 22–32)
CREATININE: 1.33 mg/dL — AB (ref 0.61–1.24)
Calcium: 9.5 mg/dL (ref 8.9–10.3)
Chloride: 106 mmol/L (ref 98–111)
GFR calc Af Amer: >60 mL/min (ref >60)
GFR calc non Af Amer: 54 mL/min — ABNORMAL LOW (ref >60)
Glucose, Bld: 101 mg/dL — ABNORMAL HIGH (ref 70–99)
Potassium: 4.1 mmol/L (ref 3.5–5.1)
SODIUM: 138 mmol/L (ref 135–145)

## 2018-02-07 LAB — SURGICAL PCR SCREEN
MRSA, PCR: NEGATIVE
Staphylococcus aureus: NEGATIVE

## 2018-02-07 LAB — PROTIME-INR
INR: 0.97
Prothrombin Time: 12.8 seconds (ref 11.4–15.2)

## 2018-02-07 LAB — CBC WITH DIFFERENTIAL/PLATELET
Abs Immature Granulocytes: 0.04 10*3/uL (ref 0.00–0.07)
Basophils Absolute: 0 10*3/uL (ref 0.0–0.1)
Basophils Relative: 1 %
Eosinophils Absolute: 0.2 10*3/uL (ref 0.0–0.5)
Eosinophils Relative: 4 %
HCT: 47.3 % (ref 39.0–52.0)
Hemoglobin: 15.5 g/dL (ref 13.0–17.0)
Immature Granulocytes: 1 %
Lymphocytes Relative: 24 %
Lymphs Abs: 1.4 10*3/uL (ref 0.7–4.0)
MCH: 30.6 pg (ref 26.0–34.0)
MCHC: 32.8 g/dL (ref 30.0–36.0)
MCV: 93.3 fL (ref 80.0–100.0)
Monocytes Absolute: 0.5 10*3/uL (ref 0.1–1.0)
Monocytes Relative: 8 %
Neutro Abs: 3.8 10*3/uL (ref 1.7–7.7)
Neutrophils Relative %: 62 %
Platelets: 244 10*3/uL (ref 150–400)
RBC: 5.07 MIL/uL (ref 4.22–5.81)
RDW: 12.8 % (ref 11.5–15.5)
WBC: 6 10*3/uL (ref 4.0–10.5)
nRBC: 0 % (ref 0.0–0.2)

## 2018-02-07 NOTE — Patient Instructions (Signed)
Your procedure is scheduled on: 02/13/18 Wed Report to Same Day Surgery 2nd floor medical mall [facility] Entrance-take elevator on left to 2nd floor.  Check in with surgery information desk.) To find out your arrival time please call [phone] between 1PM - 3PM on 02/12/18 Tues  Remember: Instructions that are not followed completely may result in serious medical risk, up to and including death, or upon the discretion of your surgeon and anesthesiologist your surgery may need to be rescheduled.    _x___ 1. Do not eat food after midnight the night before your procedure. You may drink clear liquids up to 2 hours before you are scheduled to arrive at the hospital for your procedure.  Do not drink clear liquids within 2 hours of your scheduled arrival to the hospital.  Clear liquids include  --Water or Apple juice without pulp  --Clear carbohydrate beverage such as ClearFast or Gatorade  --Black Coffee or Clear Tea (No milk, no creamers, do not add anything to                  the coffee or Tea Type 1 and type 2 diabetics should only drink water.   ____Ensure clear carbohydrate drink on the way to the hospital for bariatric patients  ____Ensure clear carbohydrate drink 3 hours before surgery for Dr Dwyane Luo patients if physician instructed.   No gum chewing or hard candies.     __x__ 2. No Alcohol for 24 hours before or after surgery.   __x__3. No Smoking or e-cigarettes for 24 prior to surgery.  Do not use any chewable tobacco products for at least 6 hour prior to surgery   ____  4. Bring all medications with you on the day of surgery if instructed.    __x__ 5. Notify your doctor if there is any change in your medical condition     (cold, fever, infections).    x___6. On the morning of surgery brush your teeth with toothpaste and water.  You may rinse your mouth with mouth wash if you wish.  Do not swallow any toothpaste or mouthwash.   Do not wear jewelry, make-up, hairpins,  clips or nail polish.  Do not wear lotions, powders, or perfumes. You may wear deodorant.  Do not shave 48 hours prior to surgery. Men may shave face and neck.  Do not bring valuables to the hospital.    [facility] is not responsible for any belongings or valuables.               Contacts, dentures or bridgework may not be worn into surgery.  Leave your suitcase in the car. After surgery it may be brought to your room.  For patients admitted to the hospital, discharge time is determined by your                       treatment team.  _  Patients discharged the day of surgery will not be allowed to drive home.  You will need someone to drive you home and stay with you the night of your procedure.    Please read over the following fact sheets that you were given:   [facility] Preparing for Surgery and or MRSA Information   _x___ Take anti-hypertensive listed below, cardiac, seizure, asthma,     anti-reflux and psychiatric medicines. These include:  1. losartan (COZAAR) 100 MG tablet  2.omeprazole (PRILOSEC) 20 MG capsule  3.  4.  5.  6.  ____Fleets enema or Magnesium Citrate as directed.   _x___ Use CHG Soap or sage wipes as directed on instruction sheet   ____ Use inhalers on the day of surgery and bring to hospital day of surgery  ____ Stop Metformin and Janumet 2 days prior to surgery.    ____ Take 1/2 of usual insulin dose the night before surgery and none on the morning     surgery.   _x___ Follow recommendations from Cardiologist, Pulmonologist or PCP regarding          stopping Aspirin, Coumadin, Plavix ,Eliquis, Effient, or Pradaxa, and Pletal.  X____Stop Anti-inflammatories such as Advil, Aleve, Ibuprofen, Motrin, Naproxen, Naprosyn, Goodies powders or aspirin products. OK to take Tylenol and                          Celebrex.   _x___ Stop supplements until after surgery.  But may continue Vitamin D, Vitamin B,       and multivitamin.   ____ Bring C-Pap to the  hospital.

## 2018-02-12 MED ORDER — CEFAZOLIN SODIUM-DEXTROSE 2-4 GM/100ML-% IV SOLN
2.0000 g | INTRAVENOUS | Status: AC
  Administered 2018-02-13: 2 g via INTRAVENOUS

## 2018-02-13 ENCOUNTER — Inpatient Hospital Stay: Payer: Medicare Other | Admitting: Anesthesiology

## 2018-02-13 ENCOUNTER — Encounter: Payer: Self-pay | Admitting: Anesthesiology

## 2018-02-13 ENCOUNTER — Inpatient Hospital Stay
Admission: RE | Admit: 2018-02-13 | Discharge: 2018-02-15 | DRG: 038 | Disposition: A | Discharge status: Home / Self Care | Payer: Medicare Other | Attending: Vascular Surgery | Admitting: Vascular Surgery

## 2018-02-13 ENCOUNTER — Encounter
Admission: RE | Disposition: A | Discharge status: Home / Self Care | Payer: Self-pay | Source: Home / Self Care | Attending: Vascular Surgery

## 2018-02-13 ENCOUNTER — Other Ambulatory Visit: Payer: Self-pay

## 2018-02-13 DIAGNOSIS — Z888 Allergy status to other drugs, medicaments and biological substances status: Secondary | ICD-10-CM | POA: Diagnosis not present

## 2018-02-13 DIAGNOSIS — H9319 Tinnitus, unspecified ear: Secondary | ICD-10-CM | POA: Diagnosis present

## 2018-02-13 DIAGNOSIS — K219 Gastro-esophageal reflux disease without esophagitis: Secondary | ICD-10-CM | POA: Diagnosis present

## 2018-02-13 DIAGNOSIS — I6521 Occlusion and stenosis of right carotid artery: Secondary | ICD-10-CM | POA: Diagnosis present

## 2018-02-13 DIAGNOSIS — F17290 Nicotine dependence, other tobacco product, uncomplicated: Secondary | ICD-10-CM | POA: Diagnosis present

## 2018-02-13 DIAGNOSIS — Z7982 Long term (current) use of aspirin: Secondary | ICD-10-CM | POA: Diagnosis not present

## 2018-02-13 DIAGNOSIS — E785 Hyperlipidemia, unspecified: Secondary | ICD-10-CM | POA: Diagnosis present

## 2018-02-13 DIAGNOSIS — Z8673 Personal history of transient ischemic attack (TIA), and cerebral infarction without residual deficits: Secondary | ICD-10-CM

## 2018-02-13 DIAGNOSIS — Z7902 Long term (current) use of antithrombotics/antiplatelets: Secondary | ICD-10-CM

## 2018-02-13 DIAGNOSIS — I9581 Postprocedural hypotension: Secondary | ICD-10-CM | POA: Diagnosis not present

## 2018-02-13 DIAGNOSIS — Z791 Long term (current) use of non-steroidal anti-inflammatories (NSAID): Secondary | ICD-10-CM | POA: Diagnosis not present

## 2018-02-13 DIAGNOSIS — H34231 Retinal artery branch occlusion, right eye: Secondary | ICD-10-CM | POA: Diagnosis present

## 2018-02-13 DIAGNOSIS — Z79899 Other long term (current) drug therapy: Secondary | ICD-10-CM

## 2018-02-13 DIAGNOSIS — I10 Essential (primary) hypertension: Secondary | ICD-10-CM | POA: Diagnosis present

## 2018-02-13 HISTORY — PX: ENDARTERECTOMY: SHX5162

## 2018-02-13 LAB — GLUCOSE, CAPILLARY: Glucose-Capillary: 121 mg/dL — ABNORMAL HIGH (ref 70–99)

## 2018-02-13 SURGERY — ENDARTERECTOMY, CAROTID
Anesthesia: General | Laterality: Right

## 2018-02-13 MED ORDER — PHENYLEPHRINE HCL 10 MG/ML IJ SOLN
INTRAMUSCULAR | Status: DC | PRN
  Administered 2018-02-13: 50 ug via INTRAVENOUS
  Administered 2018-02-13: 100 ug via INTRAVENOUS
  Administered 2018-02-13: 50 ug via INTRAVENOUS
  Administered 2018-02-13: 100 ug via INTRAVENOUS
  Administered 2018-02-13: 50 ug via INTRAVENOUS

## 2018-02-13 MED ORDER — SODIUM CHLORIDE FLUSH 0.9 % IV SOLN
INTRAVENOUS | Status: AC
  Filled 2018-02-13: qty 10

## 2018-02-13 MED ORDER — LIDOCAINE HCL (PF) 1 % IJ SOLN
INTRAMUSCULAR | Status: AC
  Filled 2018-02-13: qty 30

## 2018-02-13 MED ORDER — PHENOL 1.4 % MT LIQD
1.0000 | OROMUCOSAL | Status: DC | PRN
  Filled 2018-02-13: qty 177

## 2018-02-13 MED ORDER — LIDOCAINE HCL 1 % IJ SOLN
INTRAMUSCULAR | Status: DC | PRN
  Administered 2018-02-13: 10 mL

## 2018-02-13 MED ORDER — DOPAMINE-DEXTROSE 3.2-5 MG/ML-% IV SOLN
INTRAVENOUS | Status: AC
  Administered 2018-02-13: 3 ug/kg/min via INTRAVENOUS
  Filled 2018-02-13: qty 250

## 2018-02-13 MED ORDER — SODIUM CHLORIDE 0.9 % IV SOLN
INTRAVENOUS | Status: DC | PRN
  Administered 2018-02-13: 50 mL via INTRAMUSCULAR

## 2018-02-13 MED ORDER — MORPHINE SULFATE (PF) 2 MG/ML IV SOLN: 2.0000 mg | INTRAVENOUS | Status: DC | PRN

## 2018-02-13 MED ORDER — ASPIRIN-ACETAMINOPHEN-CAFFEINE 250-250-65 MG PO TABS
1.0000 | ORAL_TABLET | Freq: Every day | ORAL | Status: DC | PRN
  Filled 2018-02-13: qty 1

## 2018-02-13 MED ORDER — ONDANSETRON HCL 4 MG/2ML IJ SOLN
INTRAMUSCULAR | Status: AC
  Filled 2018-02-13: qty 2

## 2018-02-13 MED ORDER — SODIUM CHLORIDE 0.9 % IV SOLN
INTRAVENOUS | Status: AC
  Administered 2018-02-13 – 2018-02-14 (×2): via INTRAVENOUS

## 2018-02-13 MED ORDER — FENTANYL CITRATE (PF) 100 MCG/2ML IJ SOLN
INTRAMUSCULAR | Status: AC
  Filled 2018-02-13: qty 2

## 2018-02-13 MED ORDER — CEFAZOLIN SODIUM-DEXTROSE 2-4 GM/100ML-% IV SOLN
INTRAVENOUS | Status: AC
  Filled 2018-02-13: qty 100

## 2018-02-13 MED ORDER — HYDRALAZINE HCL 20 MG/ML IJ SOLN: 5.0000 mg | INTRAMUSCULAR | Status: DC | PRN

## 2018-02-13 MED ORDER — MIDAZOLAM HCL 2 MG/2ML IJ SOLN
INTRAMUSCULAR | Status: DC | PRN
  Administered 2018-02-13: 2 mg via INTRAVENOUS

## 2018-02-13 MED ORDER — EPHEDRINE SULFATE 50 MG/ML IJ SOLN
10.0000 mg | Freq: Once | INTRAMUSCULAR | Status: AC
  Administered 2018-02-13: 10 mg via INTRAVENOUS

## 2018-02-13 MED ORDER — ACETAMINOPHEN 325 MG PO TABS: 325.0000 mg | ORAL_TABLET | ORAL | Status: DC | PRN

## 2018-02-13 MED ORDER — ESMOLOL HCL-SODIUM CHLORIDE 2000 MG/100ML IV SOLN: 25.0000 ug/kg/min | INTRAVENOUS | Status: DC

## 2018-02-13 MED ORDER — EPHEDRINE SULFATE 50 MG/ML IJ SOLN
INTRAMUSCULAR | Status: AC
  Administered 2018-02-13: 10 mg via INTRAVENOUS
  Filled 2018-02-13: qty 1

## 2018-02-13 MED ORDER — ALUM & MAG HYDROXIDE-SIMETH 200-200-20 MG/5ML PO SUSP
15.0000 mL | ORAL | Status: DC | PRN
  Filled 2018-02-13: qty 30

## 2018-02-13 MED ORDER — LIDOCAINE HCL (PF) 2 % IJ SOLN
INTRAMUSCULAR | Status: AC
  Filled 2018-02-13: qty 10

## 2018-02-13 MED ORDER — DEXAMETHASONE SODIUM PHOSPHATE 10 MG/ML IJ SOLN
INTRAMUSCULAR | Status: DC | PRN
  Administered 2018-02-13: 10 mg via INTRAVENOUS

## 2018-02-13 MED ORDER — SUCCINYLCHOLINE CHLORIDE 20 MG/ML IJ SOLN
INTRAMUSCULAR | Status: AC
  Filled 2018-02-13: qty 1

## 2018-02-13 MED ORDER — SUGAMMADEX SODIUM 200 MG/2ML IV SOLN
INTRAVENOUS | Status: AC
  Filled 2018-02-13: qty 2

## 2018-02-13 MED ORDER — HEPARIN SODIUM (PORCINE) 1000 UNIT/ML IJ SOLN
INTRAMUSCULAR | Status: AC
  Filled 2018-02-13: qty 1

## 2018-02-13 MED ORDER — DOCUSATE SODIUM 100 MG PO CAPS
100.0000 mg | ORAL_CAPSULE | Freq: Every day | ORAL | Status: DC
  Administered 2018-02-15: 100 mg via ORAL
  Filled 2018-02-13: qty 1

## 2018-02-13 MED ORDER — DOPAMINE-DEXTROSE 3.2-5 MG/ML-% IV SOLN
2.0000 ug/kg/min | INTRAVENOUS | Status: DC
  Administered 2018-02-13: 3 ug/kg/min via INTRAVENOUS
  Administered 2018-02-14: 5 ug/kg/min via INTRAVENOUS
  Filled 2018-02-13: qty 250

## 2018-02-13 MED ORDER — ONDANSETRON HCL 4 MG/2ML IJ SOLN: 4.0000 mg | Freq: Once | INTRAMUSCULAR | Status: DC | PRN

## 2018-02-13 MED ORDER — ROCURONIUM BROMIDE 100 MG/10ML IV SOLN
INTRAVENOUS | Status: DC | PRN
  Administered 2018-02-13: 40 mg via INTRAVENOUS
  Administered 2018-02-13: 10 mg via INTRAVENOUS

## 2018-02-13 MED ORDER — DEXAMETHASONE SODIUM PHOSPHATE 10 MG/ML IJ SOLN
INTRAMUSCULAR | Status: AC
  Filled 2018-02-13: qty 1

## 2018-02-13 MED ORDER — PANTOPRAZOLE SODIUM 40 MG PO TBEC
40.0000 mg | DELAYED_RELEASE_TABLET | Freq: Every day | ORAL | Status: DC
  Administered 2018-02-14 – 2018-02-15 (×2): 40 mg via ORAL
  Filled 2018-02-13 (×2): qty 1

## 2018-02-13 MED ORDER — CEFAZOLIN SODIUM-DEXTROSE 2-4 GM/100ML-% IV SOLN
2.0000 g | Freq: Three times a day (TID) | INTRAVENOUS | Status: AC
  Administered 2018-02-13 – 2018-02-14 (×2): 2 g via INTRAVENOUS
  Filled 2018-02-13 (×2): qty 100

## 2018-02-13 MED ORDER — ROCURONIUM BROMIDE 50 MG/5ML IV SOLN
INTRAVENOUS | Status: AC
  Filled 2018-02-13: qty 1

## 2018-02-13 MED ORDER — ROSUVASTATIN CALCIUM 10 MG PO TABS
5.0000 mg | ORAL_TABLET | Freq: Every day | ORAL | Status: DC
  Administered 2018-02-13 – 2018-02-14 (×2): 5 mg via ORAL
  Filled 2018-02-13 (×3): qty 1

## 2018-02-13 MED ORDER — PROPOFOL 10 MG/ML IV BOLUS
INTRAVENOUS | Status: AC
  Filled 2018-02-13: qty 20

## 2018-02-13 MED ORDER — IBUPROFEN 400 MG PO TABS: 200.0000 mg | ORAL_TABLET | Freq: Two times a day (BID) | ORAL | Status: DC | PRN

## 2018-02-13 MED ORDER — HEPARIN SODIUM (PORCINE) 1000 UNIT/ML IJ SOLN
INTRAMUSCULAR | Status: DC | PRN
  Administered 2018-02-13: 6000 [IU] via INTRAVENOUS

## 2018-02-13 MED ORDER — METOPROLOL TARTRATE 5 MG/5ML IV SOLN: 2.0000 mg | INTRAVENOUS | Status: DC | PRN

## 2018-02-13 MED ORDER — ACETAMINOPHEN 325 MG PO TABS
ORAL_TABLET | ORAL | Status: AC
  Administered 2018-02-13: 650 mg via ORAL
  Filled 2018-02-13: qty 2

## 2018-02-13 MED ORDER — LOSARTAN POTASSIUM 50 MG PO TABS: 100.0000 mg | ORAL_TABLET | Freq: Every day | ORAL | Status: DC

## 2018-02-13 MED ORDER — OXYCODONE-ACETAMINOPHEN 5-325 MG PO TABS: 1.0000 | ORAL_TABLET | ORAL | Status: DC | PRN

## 2018-02-13 MED ORDER — ASPIRIN EC 81 MG PO TBEC
81.0000 mg | DELAYED_RELEASE_TABLET | Freq: Every day | ORAL | Status: DC
  Administered 2018-02-13 – 2018-02-15 (×3): 81 mg via ORAL
  Filled 2018-02-13 (×3): qty 1

## 2018-02-13 MED ORDER — FENTANYL CITRATE (PF) 100 MCG/2ML IJ SOLN
INTRAMUSCULAR | Status: DC | PRN
  Administered 2018-02-13: 50 ug via INTRAVENOUS
  Administered 2018-02-13: 100 ug via INTRAVENOUS

## 2018-02-13 MED ORDER — HEPARIN SODIUM (PORCINE) 10000 UNIT/ML IJ SOLN
INTRAMUSCULAR | Status: AC
  Filled 2018-02-13: qty 1

## 2018-02-13 MED ORDER — NITROGLYCERIN IN D5W 200-5 MCG/ML-% IV SOLN: 5.0000 ug/min | INTRAVENOUS | Status: DC

## 2018-02-13 MED ORDER — ACETAMINOPHEN 650 MG RE SUPP: 325.0000 mg | RECTAL | Status: DC | PRN

## 2018-02-13 MED ORDER — FAMOTIDINE IN NACL 20-0.9 MG/50ML-% IV SOLN
20.0000 mg | Freq: Two times a day (BID) | INTRAVENOUS | Status: DC
  Administered 2018-02-13: 20 mg via INTRAVENOUS
  Filled 2018-02-13: qty 50

## 2018-02-13 MED ORDER — MIDAZOLAM HCL 2 MG/2ML IJ SOLN
INTRAMUSCULAR | Status: AC
  Filled 2018-02-13: qty 2

## 2018-02-13 MED ORDER — GUAIFENESIN-DM 100-10 MG/5ML PO SYRP
15.0000 mL | ORAL_SOLUTION | ORAL | Status: DC | PRN
  Filled 2018-02-13: qty 15

## 2018-02-13 MED ORDER — SUGAMMADEX SODIUM 200 MG/2ML IV SOLN
INTRAVENOUS | Status: DC | PRN
  Administered 2018-02-13: 167.8 mg via INTRAVENOUS

## 2018-02-13 MED ORDER — CLOPIDOGREL BISULFATE 75 MG PO TABS
75.0000 mg | ORAL_TABLET | Freq: Every day | ORAL | Status: DC
  Administered 2018-02-15: 75 mg via ORAL
  Filled 2018-02-13: qty 1

## 2018-02-13 MED ORDER — POTASSIUM CHLORIDE CRYS ER 20 MEQ PO TBCR: 20.0000 meq | EXTENDED_RELEASE_TABLET | Freq: Every day | ORAL | Status: DC | PRN

## 2018-02-13 MED ORDER — ACETAMINOPHEN 325 MG PO TABS
650.0000 mg | ORAL_TABLET | Freq: Once | ORAL | Status: AC
  Administered 2018-02-13: 650 mg via ORAL

## 2018-02-13 MED ORDER — ONDANSETRON HCL 4 MG/2ML IJ SOLN
INTRAMUSCULAR | Status: DC | PRN
  Administered 2018-02-13: 4 mg via INTRAVENOUS

## 2018-02-13 MED ORDER — LIDOCAINE HCL (CARDIAC) PF 100 MG/5ML IV SOSY
PREFILLED_SYRINGE | INTRAVENOUS | Status: DC | PRN
  Administered 2018-02-13: 100 mg via INTRAVENOUS

## 2018-02-13 MED ORDER — ONDANSETRON HCL 4 MG/2ML IJ SOLN
4.0000 mg | Freq: Four times a day (QID) | INTRAMUSCULAR | Status: DC | PRN
  Administered 2018-02-13: 4 mg via INTRAVENOUS

## 2018-02-13 MED ORDER — FENTANYL CITRATE (PF) 100 MCG/2ML IJ SOLN: 25.0000 ug | INTRAMUSCULAR | Status: DC | PRN

## 2018-02-13 MED ORDER — SODIUM CHLORIDE (PF) 0.9 % IJ SOLN
INTRAMUSCULAR | Status: AC
  Filled 2018-02-13: qty 10

## 2018-02-13 MED ORDER — SODIUM CHLORIDE 0.9 % IV SOLN
500.0000 mL | Freq: Once | INTRAVENOUS | Status: AC | PRN
  Administered 2018-02-13: 500 mL via INTRAVENOUS

## 2018-02-13 MED ORDER — SODIUM CHLORIDE 0.9 % IV SOLN
INTRAVENOUS | Status: DC | PRN
  Administered 2018-02-13: 30 ug/min via INTRAVENOUS

## 2018-02-13 MED ORDER — EVICEL 2 ML EX KIT
PACK | CUTANEOUS | Status: DC | PRN
  Administered 2018-02-13: 1

## 2018-02-13 MED ORDER — PROPOFOL 10 MG/ML IV BOLUS
INTRAVENOUS | Status: DC | PRN
  Administered 2018-02-13: 160 mg via INTRAVENOUS

## 2018-02-13 MED ORDER — LABETALOL HCL 5 MG/ML IV SOLN: 10.0000 mg | INTRAVENOUS | Status: DC | PRN

## 2018-02-13 MED ORDER — MAGNESIUM SULFATE 2 GM/50ML IV SOLN: 2.0000 g | Freq: Every day | INTRAVENOUS | Status: DC | PRN

## 2018-02-13 MED ORDER — EVICEL 2 ML EX KIT
PACK | CUTANEOUS | Status: AC
  Filled 2018-02-13: qty 1

## 2018-02-13 MED ORDER — SUCCINYLCHOLINE CHLORIDE 20 MG/ML IJ SOLN
INTRAMUSCULAR | Status: DC | PRN
  Administered 2018-02-13: 120 mg via INTRAVENOUS

## 2018-02-13 MED ORDER — LACTATED RINGERS IV SOLN
INTRAVENOUS | Status: DC
  Administered 2018-02-13 (×2): via INTRAVENOUS

## 2018-02-13 SURGICAL SUPPLY — 65 items
BAG DECANTER FOR FLEXI CONT (MISCELLANEOUS) ×3 IMPLANT
BLADE SURG 15 STRL LF DISP TIS (BLADE) ×1 IMPLANT
BLADE SURG 15 STRL SS (BLADE) ×2
BLADE SURG SZ11 CARB STEEL (BLADE) ×3 IMPLANT
BOOT SUTURE AID YELLOW STND (SUTURE) ×3 IMPLANT
BRUSH SCRUB EZ  4% CHG (MISCELLANEOUS) ×2
BRUSH SCRUB EZ 4% CHG (MISCELLANEOUS) ×1 IMPLANT
CANISTER SUCT 1200ML W/VALVE (MISCELLANEOUS) ×3 IMPLANT
COVER WAND RF STERILE (DRAPES) ×3 IMPLANT
DERMABOND ADVANCED (GAUZE/BANDAGES/DRESSINGS) ×2
DERMABOND ADVANCED .7 DNX12 (GAUZE/BANDAGES/DRESSINGS) ×1 IMPLANT
DRAPE INCISE IOBAN 66X45 STRL (DRAPES) ×3 IMPLANT
DRAPE LAPAROTOMY 77X122 PED (DRAPES) ×3 IMPLANT
DRAPE SHEET LG 3/4 BI-LAMINATE (DRAPES) ×3 IMPLANT
DRSG TEGADERM 4X4.75 (GAUZE/BANDAGES/DRESSINGS) IMPLANT
DRSG TELFA 3X8 NADH (GAUZE/BANDAGES/DRESSINGS) IMPLANT
DURAPREP 26ML APPLICATOR (WOUND CARE) ×3 IMPLANT
ELECT CAUTERY BLADE 6.4 (BLADE) ×3 IMPLANT
ELECT REM PT RETURN 9FT ADLT (ELECTROSURGICAL) ×3
ELECTRODE REM PT RTRN 9FT ADLT (ELECTROSURGICAL) ×1 IMPLANT
GLOVE BIO SURGEON STRL SZ7 (GLOVE) ×9 IMPLANT
GLOVE INDICATOR 7.5 STRL GRN (GLOVE) ×3 IMPLANT
GOWN STRL REUS W/ TWL LRG LVL3 (GOWN DISPOSABLE) ×2 IMPLANT
GOWN STRL REUS W/ TWL XL LVL3 (GOWN DISPOSABLE) ×2 IMPLANT
GOWN STRL REUS W/TWL LRG LVL3 (GOWN DISPOSABLE) ×4
GOWN STRL REUS W/TWL XL LVL3 (GOWN DISPOSABLE) ×4
HEMOSTAT SURGICEL 2X3 (HEMOSTASIS) ×3 IMPLANT
IV NS 250ML (IV SOLUTION) ×2
IV NS 250ML BAXH (IV SOLUTION) ×1 IMPLANT
KIT TURNOVER KIT A (KITS) ×3 IMPLANT
LABEL OR SOLS (LABEL) ×3 IMPLANT
LOOP RED MAXI  1X406MM (MISCELLANEOUS) ×4
LOOP VESSEL MAXI 1X406 RED (MISCELLANEOUS) ×2 IMPLANT
LOOP VESSEL MINI 0.8X406 BLUE (MISCELLANEOUS) ×1 IMPLANT
LOOPS BLUE MINI 0.8X406MM (MISCELLANEOUS) ×2
NDL FILTER BLUNT 18X1 1/2 (NEEDLE) ×1 IMPLANT
NDL HYPO 25X1 1.5 SAFETY (NEEDLE) ×1 IMPLANT
NEEDLE FILTER BLUNT 18X 1/2SAF (NEEDLE) ×2
NEEDLE FILTER BLUNT 18X1 1/2 (NEEDLE) ×1 IMPLANT
NEEDLE HYPO 25X1 1.5 SAFETY (NEEDLE) ×3 IMPLANT
NS IRRIG 1000ML POUR BTL (IV SOLUTION) ×3 IMPLANT
PACK BASIN MAJOR ARMC (MISCELLANEOUS) ×3 IMPLANT
PAD DRESSING TELFA 3X8 NADH (GAUZE/BANDAGES/DRESSINGS) IMPLANT
PATCH CAROTID ECM VASC 1X10 (Prosthesis & Implant Heart) ×3 IMPLANT
PENCIL ELECTRO HAND CTR (MISCELLANEOUS) IMPLANT
SHUNT W TPORT 9FR PRUITT F3 (SHUNT) ×3 IMPLANT
SUT MNCRL 4-0 (SUTURE) ×2
SUT MNCRL 4-0 27XMFL (SUTURE) ×1
SUT PROLENE 6 0 BV (SUTURE) ×12 IMPLANT
SUT PROLENE 7 0 BV 1 (SUTURE) ×8 IMPLANT
SUT SILK 2 0 (SUTURE) ×2
SUT SILK 2-0 18XBRD TIE 12 (SUTURE) ×1 IMPLANT
SUT SILK 3 0 (SUTURE) ×2
SUT SILK 3-0 18XBRD TIE 12 (SUTURE) ×1 IMPLANT
SUT SILK 4 0 (SUTURE) ×2
SUT SILK 4-0 18XBRD TIE 12 (SUTURE) ×1 IMPLANT
SUT VIC AB 3-0 SH 27 (SUTURE) ×4
SUT VIC AB 3-0 SH 27X BRD (SUTURE) ×2 IMPLANT
SUTURE MNCRL 4-0 27XMF (SUTURE) ×1 IMPLANT
SYR 10ML LL (SYRINGE) ×6 IMPLANT
SYR 20CC LL (SYRINGE) ×3 IMPLANT
TOWEL OR 17X26 4PK STRL BLUE (TOWEL DISPOSABLE) ×3 IMPLANT
TRAY FOLEY MTR SLVR 16FR STAT (SET/KITS/TRAYS/PACK) ×3 IMPLANT
TUBING CONNECTING 10 (TUBING) IMPLANT
TUBING CONNECTING 10' (TUBING)

## 2018-02-13 NOTE — Anesthesia Procedure Notes (Deleted)
Performed by: Ziasia Lenoir, CRNA       

## 2018-02-13 NOTE — Anesthesia Procedure Notes (Signed)
Procedure Name: Intubation Date/Time: 02/13/2018 1:09 PM Performed by: Nelda Marseille, CRNA Pre-anesthesia Checklist: Patient identified, Patient being monitored, Timeout performed, Emergency Drugs available and Suction available Patient Re-evaluated:Patient Re-evaluated prior to induction Oxygen Delivery Method: Circle system utilized Preoxygenation: Pre-oxygenation with 100% oxygen Induction Type: IV induction Ventilation: Mask ventilation without difficulty Laryngoscope Size: McGraph and 4 Grade View: Grade II Tube type: Oral Tube size: 7.5 mm Number of attempts: 1 Airway Equipment and Method: Stylet Placement Confirmation: ETT inserted through vocal cords under direct vision,  positive ETCO2,  breath sounds checked- equal and bilateral and CO2 detector Secured at: 20 cm Tube secured with: Tape Dental Injury: Teeth and Oropharynx as per pre-operative assessment

## 2018-02-13 NOTE — Op Note (Signed)
[facility]   OPERATIVE NOTE  PROCEDURE:   1.  Right carotid endarterectomy with CorMatrix arterial patch reconstruction  PRE-OPERATIVE DIAGNOSIS: 1.  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  Right carotid plaque.  SPECIMEN(S):  Carotid plaque (sent to Pathology)  INDICATIONS:   Joel Ball. is a 68 y.o. male who presents with right eye symptoms from embolization and a right carotid stenosis of at least 70 %.  I discussed with the patient the risks, benefits, and alternatives to carotid endarterectomy.  I discussed the differences between carotid stenting and carotid endarterectomy. I discussed the procedural details of carotid endarterectomy with the patient.  The patient is aware that the risks of carotid endarterectomy include but are not limited to: bleeding, infection, stroke, myocardial infarction, death, cranial nerve injuries both temporary and permanent, neck hematoma, possible airway compromise, labile blood pressure post-operatively, cerebral hyperperfusion syndrome, and possible need for additional interventions in the future. The patient is aware of the risks and agrees to proceed forward with the procedure.  DESCRIPTION: After full informed written consent was obtained from the patient, the patient was brought back to the operating room and placed supine upon the operating table.  Prior to induction, the patient received IV antibiotics.  After obtaining adequate anesthesia, the patient was placed into a modified beach chair position with a shoulder roll in place and the patient's neck slightly hyperextended and rotated away from the surgical site.  The patient was prepped in the standard fashion for a carotid endarterectomy.  I made an incision anterior to the  sternocleidomastoid muscle and dissected down through the subcutaneous tissue.  The platysmas was opened with electrocautery.  Then I dissected down to the internal jugular vein and facial vein.  The facial vein is ligated and divided between 2-0 silk ties.  This was dissected posteriorly until I obtained visualization of the common carotid artery.  This was dissected out and then a vessel loop was placed around the common carotid artery.  I then dissected in a periadventitial fashion along the common carotid artery up to the bifurcation.  I then identified the external carotid artery and the superior thyroid artery.  I placed a vessel loop around the superior thyroid artery, and I also dissected out the external carotid artery and placed a vessel loop around it. In the process of this dissection, the hypoglossal nerve was identified and protected from harm.  I then dissected out the internal carotid artery until I identified an area in the internal carotid artery clearly above the stenosis.  I dissected slightly distal to this area, and placed a vessel loop around the artery.  At this point, we gave the patient 6000 units of intravenous heparin.  After this was allowed to circulate for several minutes, I pulled up control on the vessel loops to clamp the internal carotid artery, external carotid artery, superior thyroid artery, and then the common carotid artery.  I then made an arteriotomy in the common carotid artery with a 11 blade, and extended the arteriotomy with a Potts scissor down into the common carotid artery, then I carried the arteriotomy through the bifurcation into the internal carotid artery until I reached an area that was not diseased.  At this point, I took the [place] shunt that previously been prepared  and I inserted it into the internal carotid artery first, and then into the common carotid artery taking care to flush and de-air prior to release of control. At this point, I started the  endarterectomy in the common carotid artery with a Penfield elevator and carried this dissection down into the common carotid artery circumferentially.  Then I transected the plaque at a segment where it was adherent and transected the plaque with Potts scissors.  I then carried this dissection up into the external carotid artery.  The plaque was extracted by unclamping the external carotid artery and performing an eversion endarterectomy.  The dissection was then carried into the internal carotid artery where a nice feathered end point was created with gentle traction.  I passed the plaque off the field as a specimen. At this point I removed all loose flecks and remaining disease possible.  At this point, I was satisfied that the minimal remaining disease was densely adherent to the wall and wall integrity was intact. The distal endpoint was tacked down with three 7-0 Prolene sutures.  I then fashioned a CorMatrix arterial patch for the artery and sewed it in place with two running stitch of 6-0 Prolene.  I started at the distal endpoint and ran one half the length of the arteriotomy.  I then cut and beveled the patch to an appropriate length to match the arteriotomy.  I started the second 6-0 Prolene at the proximal end point.  The medial suture line was completed and the lateral suture line was run approximately one quarter the length of the arteriotomy.  Prior to completing this patch angioplasty, I removed the shunt first from the internal carotid artery, from which there was excellent backbleeding, and clamped it.  Then I removed the shunt from the common carotid artery, from which there was excellent antegrade bleeding, and then clamped it.  At this point, I allowed the external carotid artery to backbleed, which was excellent.  Then I instilled heparinized saline in this patched artery and then completed the patch angioplasty in the usual fashion.  First, I released the clamp on the external carotid artery,  then I released it on the common carotid artery.  After waiting a few seconds, I then released it on the internal carotid artery. Several minutes of pressure were held and 6-0 Prolene patch sutures were used as need for hemostasis.  At this point, I placed Surgicel and Evicel topical hemostatic agents.  There was no more active bleeding in the surgical site.  The sternocleidomastoid space was closed with three interrupted 3-0 Vicryl sutures. I then reapproximated the platysma muscle with a running stitch of 3-0 Vicryl.  The skin was then closed with a running subcuticular 4-0 Monocryl.  The skin was then cleaned, dried and Dermabond was used to reinforce the skin closure.  The patient awakened and was taken to the recovery room in stable condition, following commands and moving all four extremities without any apparent deficits.    COMPLICATIONS: none  CONDITION: stable  Leotis Pain  02/13/2018, 3:05 PM    This note was created with Dragon Medical transcription system. Any errors in dictation are purely unintentional.

## 2018-02-13 NOTE — H&P (Signed)
[facility] History & Physical Update  The patient was interviewed and re-examined.  The patient's previous History and Physical has been reviewed and is unchanged.  There is no change in the plan of care. We plan to proceed with the scheduled procedure.  Leotis Pain, MD  02/13/2018, 11:54 AM

## 2018-02-13 NOTE — Anesthesia Post-op Follow-up Note (Signed)
Anesthesia QCDR form completed.        

## 2018-02-13 NOTE — Anesthesia Preprocedure Evaluation (Addendum)
Anesthesia Evaluation  Patient identified by MRN, date of birth, ID band Patient awake    Reviewed: Allergy & Precautions, NPO status , Patient's Chart, lab work & pertinent test results, reviewed documented beta blocker date and time   Airway Mallampati: III  TM Distance: >3 FB     Dental  (+) Chipped   Pulmonary Current Smoker,           Cardiovascular hypertension, Pt. on medications      Neuro/Psych CVA    GI/Hepatic   Endo/Other    Renal/GU      Musculoskeletal   Abdominal   Peds  Hematology   Anesthesia Other Findings On plavix. allens test ok.  Reproductive/Obstetrics                            Anesthesia Physical Anesthesia Plan  ASA: III  Anesthesia Plan: General   Post-op Pain Management:    Induction: Intravenous  PONV Risk Score and Plan:   Airway Management Planned: Oral ETT  Additional Equipment:   Intra-op Plan:   Post-operative Plan:   Informed Consent: I have reviewed the patients History and Physical, chart, labs and discussed the procedure including the risks, benefits and alternatives for the proposed anesthesia with the patient or authorized representative who has indicated his/her understanding and acceptance.       Plan Discussed with: CRNA  Anesthesia Plan Comments:         Anesthesia Quick Evaluation

## 2018-02-13 NOTE — Transfer of Care (Signed)
Immediate Anesthesia Transfer of Care Note  Patient: Joel Ball.  Procedure(s) Performed: ENDARTERECTOMY CAROTID (Right )  Patient Location: PACU  Anesthesia Type:General  Level of Consciousness: awake and alert   Airway & Oxygen Therapy: Patient Spontanous Breathing and Patient connected to face mask oxygen  Post-op Assessment: Report given to RN and Post -op Vital signs reviewed and stable  Post vital signs: Reviewed and stable  Last Vitals:  Vitals Value Taken Time  BP 87/62 02/13/2018  3:23 PM  Temp 36.8 C 02/13/2018  3:18 PM  Pulse 60 02/13/2018  3:24 PM  Resp 23 02/13/2018  3:24 PM  SpO2 100 % 02/13/2018  3:24 PM  Vitals shown include unvalidated device data.  Last Pain:  Vitals:   02/13/18 1518  TempSrc:   PainSc: 0-No pain         Complications: No apparent anesthesia complications

## 2018-02-14 LAB — BASIC METABOLIC PANEL
Anion gap: 9 (ref 5–15)
BUN: 17 mg/dL (ref 8–23)
CO2: 21 mmol/L — ABNORMAL LOW (ref 22–32)
Calcium: 8.8 mg/dL — ABNORMAL LOW (ref 8.9–10.3)
Chloride: 108 mmol/L (ref 98–111)
Creatinine, Ser: 1.21 mg/dL (ref 0.61–1.24)
GFR calc Af Amer: >60 mL/min (ref >60)
GFR calc non Af Amer: >60 mL/min (ref >60)
Glucose, Bld: 151 mg/dL — ABNORMAL HIGH (ref 70–99)
Potassium: 3.9 mmol/L (ref 3.5–5.1)
Sodium: 138 mmol/L (ref 135–145)

## 2018-02-14 LAB — CBC
HEMATOCRIT: 45.3 % (ref 39.0–52.0)
Hemoglobin: 14.7 g/dL (ref 13.0–17.0)
MCH: 30.4 pg (ref 26.0–34.0)
MCHC: 32.5 g/dL (ref 30.0–36.0)
MCV: 93.8 fL (ref 80.0–100.0)
Platelets: 223 10*3/uL (ref 150–400)
RBC: 4.83 MIL/uL (ref 4.22–5.81)
RDW: 12.3 % (ref 11.5–15.5)
WBC: 13 10*3/uL — ABNORMAL HIGH (ref 4.0–10.5)
nRBC: 0 % (ref 0.0–0.2)

## 2018-02-14 NOTE — Anesthesia Postprocedure Evaluation (Signed)
Anesthesia Post Note  Patient: Joel Ball.  Procedure(s) Performed: ENDARTERECTOMY CAROTID (Right )  Patient location during evaluation: ICU Anesthesia Type: General Level of consciousness: awake, awake and alert and oriented Pain management: pain level controlled Vital Signs Assessment: post-procedure vital signs reviewed and stable Respiratory status: spontaneous breathing Cardiovascular status: stable Anesthetic complications: no     Last Vitals:  Vitals:   02/14/18 0600 02/14/18 0615  BP: (!) 99/53 (!) 90/57  Pulse: (!) 51 (!) 59  Resp: 14 17  Temp:    SpO2: 97% 97%    Last Pain:  Vitals:   02/14/18 0400  TempSrc: Oral  PainSc: Asleep                 Dezirae Service,  Joeleen Wortley R

## 2018-02-14 NOTE — Progress Notes (Signed)
Dopamine titrated down to 2.5 mcg and pts BP dropped 83/53. Dopamine turned up to 3 mcg and pt's BP dropped to 75/54. RN turned dopamine back up to 5 mcg and will continue to monitor closely.

## 2018-02-14 NOTE — Progress Notes (Signed)
[facility] Daily Progress Note   Subjective: 1 Day Post-Op: Right carotid endarterectomy with CorMatrix arterial patch reconstruction  Patient without complaint. No issues overnight. Low BP on dopamine currently.   Objective: Vitals:   02/14/18 0901 02/14/18 1000 02/14/18 1100 02/14/18 1131  BP: (!) 82/53 (!) 90/58 (!) 94/58 (!) 82/54  Pulse:  (!) 58 (!) 54   Resp:  19 18   Temp:      TempSrc:      SpO2:      Weight:      Height:        Intake/Output Summary (Last 24 hours) at 02/14/2018 1159 Last data filed at 02/14/2018 1110 Gross per 24 hour  Intake 3340.38 ml  Output 2650 ml  Net 690.38 ml   Physical Exam: A&Ox3, NAD Face: Symmetrical, Tongue midline Neck: Minimal swelling. Incision - clean, dry and intact. Some ecchymosis. Trachea midline CV: RRR Pulmonary: CTA Bilaterally Abdomen: Soft, Nontender, Nondistended GU: foley intact, draining clear urine Vascular: Warm, Non-tender, Minimal edema Neuro: 5/5 motor / sensory, intact upper / lower extremity   Laboratory: CBC    Component Value Date/Time   WBC 13.0 (H) 02/14/2018 0335   HGB 14.7 02/14/2018 0335   HCT 45.3 02/14/2018 0335   PLT 223 02/14/2018 0335   BMET    Component Value Date/Time   NA 138 02/14/2018 0335   K 3.9 02/14/2018 0335   CL 108 02/14/2018 0335   CO2 21 (L) 02/14/2018 0335   GLUCOSE 151 (H) 02/14/2018 0335   BUN 17 02/14/2018 0335   CREATININE 1.21 02/14/2018 0335   CALCIUM 8.8 (L) 02/14/2018 0335   GFRNONAA >60 02/14/2018 0335   GFRAA >60 02/14/2018 0335   Assessment/Planning: The patient is a 68 year old male s/p right carotid endarterectomy  1) On Dopamine for hypotension, asymptomatic at this time 2) H&H stable 3) Increased fluids to 125 4) d/c foley 5) OOB / ambulate 6) continue to try to wean pressors  Discussed with Dr. Ellis Parents Remmie Bembenek PA-C 02/14/2018 11:59 AM

## 2018-02-14 NOTE — Care Management Note (Signed)
Case Management Note  Patient Details  Name: Joel Ball. MRN: [id number] Date of Birth: Apr 20, 1950  Subjective/Objective:  Patient admitted with right carotid stenosis s/p right carotid endarterectomy.  Patient is sitting up in the bedside chair, wife, [name] in the room with the patient.  Patient is from home with wife.  Patient is independent, drives and requires no assistive devices.  PCP verified as Dr. Baldemar Lenis who he sees twice per year.  Pharmacy is [facility] in [place].  No discharge needs identified. [name] RN BSN [phone]                    Action/Plan:   Expected Discharge Date:                  Expected Discharge Plan:  Home/Self Care  In-House Referral:     Discharge planning Services  CM Consult  Post Acute Care Choice:    Choice offered to:     DME Arranged:    DME Agency:     HH Arranged:    [place] Agency:     Status of Service:  Completed, signed off  If discussed at [place] of Stay Meetings, dates discussed:    Additional Comments:  Shelbie Hutching, RN 02/14/2018, 11:55 AM

## 2018-02-15 ENCOUNTER — Encounter: Payer: Self-pay | Admitting: Vascular Surgery

## 2018-02-15 DIAGNOSIS — Z9889 Other specified postprocedural states: Secondary | ICD-10-CM

## 2018-02-15 LAB — CBC
HCT: 41.2 % (ref 39.0–52.0)
Hemoglobin: 13.5 g/dL (ref 13.0–17.0)
MCH: 30.9 pg (ref 26.0–34.0)
MCHC: 32.8 g/dL (ref 30.0–36.0)
MCV: 94.3 fL (ref 80.0–100.0)
Platelets: 208 10*3/uL (ref 150–400)
RBC: 4.37 MIL/uL (ref 4.22–5.81)
RDW: 12.8 % (ref 11.5–15.5)
WBC: 11.8 10*3/uL — ABNORMAL HIGH (ref 4.0–10.5)
nRBC: 0 % (ref 0.0–0.2)

## 2018-02-15 LAB — BASIC METABOLIC PANEL
Anion gap: 5 (ref 5–15)
BUN: 22 mg/dL (ref 8–23)
CO2: 24 mmol/L (ref 22–32)
CREATININE: 1.2 mg/dL (ref 0.61–1.24)
Calcium: 8.5 mg/dL — ABNORMAL LOW (ref 8.9–10.3)
Chloride: 111 mmol/L (ref 98–111)
GFR calc Af Amer: >60 mL/min (ref >60)
GFR calc non Af Amer: >60 mL/min (ref >60)
Glucose, Bld: 106 mg/dL — ABNORMAL HIGH (ref 70–99)
Potassium: 3.8 mmol/L (ref 3.5–5.1)
SODIUM: 140 mmol/L (ref 135–145)

## 2018-02-15 LAB — TROPONIN I: Troponin I: 0.03 ng/mL (ref <0.03)

## 2018-02-15 LAB — SURGICAL PATHOLOGY

## 2018-02-15 LAB — MAGNESIUM: Magnesium: 2.3 mg/dL (ref 1.7–2.4)

## 2018-02-15 MED ORDER — CLOPIDOGREL BISULFATE 75 MG PO TABS: 75.0000 mg | ORAL_TABLET | Freq: Every day | ORAL | 1 refills | Status: DC

## 2018-02-15 MED ORDER — OXYCODONE-ACETAMINOPHEN 5-325 MG PO TABS: 1.0000 | ORAL_TABLET | Freq: Four times a day (QID) | ORAL | 0 refills | Status: DC | PRN

## 2018-02-15 NOTE — Consult Note (Signed)
Reason for Consult: No chief complaint on file.  Referring Physician: Dr. Lucky Cowboy  Reason for consult blood pressure management  Joel Ball. is an 68 y.o. male.  HPI: Mr. Jacy is a 68 year old male with past medical history of hypertension, stroke and other medical problems is admitted to vascular surgery service for right-sided carotid endarterectomy.  Procedure was done but patient was hypotensive and is placed on dopamine drip.  Hospitalist team is consulted for blood pressure management.  During my examination patient is resting comfortably.  Denies any chest pain or shortness of breath.  Wife at bedside.  Dopamine drip was just discontinued  Past Medical History:  Diagnosis Date  . GERD (gastroesophageal reflux disease)   . Hypertension   . Stroke ([place])   . Tinnitus     Past Surgical History:  Procedure Laterality Date  . COLONOSCOPY WITH ESOPHAGOGASTRODUODENOSCOPY (EGD)    . COLONOSCOPY WITH PROPOFOL N/A 05/30/2016   Procedure: COLONOSCOPY WITH PROPOFOL;  Surgeon: Lollie Sails, MD;  Location: [facility] ENDOSCOPY;  Service: Endoscopy;  Laterality: N/A;  . ENDARTERECTOMY Left 09/27/2016   Procedure: ENDARTERECTOMY CAROTID;  Surgeon: Algernon Huxley, MD;  Location: [facility] ORS;  Service: Vascular;  Laterality: Left;  . ENDARTERECTOMY Right 02/13/2018   Procedure: ENDARTERECTOMY CAROTID;  Surgeon: Algernon Huxley, MD;  Location: [facility] ORS;  Service: Vascular;  Laterality: Right;    Family History  Problem Relation Age of Onset  . CAD Neg Hx     Social History:  reports that he has been smoking cigars. He has never used smokeless tobacco. He reports current alcohol use of about 15.0 standard drinks of alcohol per week. He reports that he does not use drugs.  Allergies:  Allergies  Allergen Reactions  . Statins     In high doses causes muscle pain    Medications: I have reviewed the patient's current medications.  Results for orders placed or performed during the hospital encounter  of 02/13/18 (from the past 48 hour(s))  Glucose, capillary     Status: Abnormal   Collection Time: 02/13/18  5:15 PM  Result Value Ref Range   Glucose-Capillary 121 (H) 70 - 99 mg/dL  CBC     Status: Abnormal   Collection Time: 02/14/18  3:35 AM  Result Value Ref Range   WBC 13.0 (H) 4.0 - 10.5 K/uL   RBC 4.83 4.22 - 5.81 MIL/uL   Hemoglobin 14.7 13.0 - 17.0 g/dL   HCT 45.3 39.0 - 52.0 %   MCV 93.8 80.0 - 100.0 fL   MCH 30.4 26.0 - 34.0 pg   MCHC 32.5 30.0 - 36.0 g/dL   RDW 12.3 11.5 - 15.5 %   Platelets 223 150 - 400 K/uL   nRBC 0.0 0.0 - 0.2 %    Comment: Performed at [facility], [address] [place], [place] [address]  Basic metabolic panel     Status: Abnormal   Collection Time: 02/14/18  3:35 AM  Result Value Ref Range   Sodium 138 135 - 145 mmol/L   Potassium 3.9 3.5 - 5.1 mmol/L   Chloride 108 98 - 111 mmol/L   CO2 21 (L) 22 - 32 mmol/L   Glucose, Bld 151 (H) 70 - 99 mg/dL   BUN 17 8 - 23 mg/dL   Creatinine, Ser 1.21 0.61 - 1.24 mg/dL   Calcium 8.8 (L) 8.9 - 10.3 mg/dL   GFR calc non Af Amer >60 >60 mL/min   GFR calc Af Amer >  60 >60 mL/min   Anion gap 9 5 - 15    Comment: Performed at [facility], [address] [place], [place] [address]  CBC     Status: Abnormal   Collection Time: 02/15/18  5:05 AM  Result Value Ref Range   WBC 11.8 (H) 4.0 - 10.5 K/uL   RBC 4.37 4.22 - 5.81 MIL/uL   Hemoglobin 13.5 13.0 - 17.0 g/dL   HCT 41.2 39.0 - 52.0 %   MCV 94.3 80.0 - 100.0 fL   MCH 30.9 26.0 - 34.0 pg   MCHC 32.8 30.0 - 36.0 g/dL   RDW 12.8 11.5 - 15.5 %   Platelets 208 150 - 400 K/uL   nRBC 0.0 0.0 - 0.2 %    Comment: Performed at [facility], [address] [place], [place] [address]  Basic metabolic panel     Status: Abnormal   Collection Time: 02/15/18  5:05 AM  Result Value Ref Range   Sodium 140 135 - 145 mmol/L   Potassium 3.8 3.5 - 5.1 mmol/L   Chloride 111 98 - 111 mmol/L   CO2 24 22 - 32 mmol/L   Glucose, Bld 106  (H) 70 - 99 mg/dL   BUN 22 8 - 23 mg/dL   Creatinine, Ser 1.20 0.61 - 1.24 mg/dL   Calcium 8.5 (L) 8.9 - 10.3 mg/dL   GFR calc non Af Amer >60 >60 mL/min   GFR calc Af Amer >60 >60 mL/min   Anion gap 5 5 - 15    Comment: Performed at [facility], [address], [place], [place] [address]  Magnesium     Status: None   Collection Time: 02/15/18  5:05 AM  Result Value Ref Range   Magnesium 2.3 1.7 - 2.4 mg/dL    Comment: Performed at [facility], [address] [place], [place] [address]  Troponin I - Now Then Q6H     Status: Abnormal   Collection Time: 02/15/18 12:47 PM  Result Value Ref Range   Troponin I 0.03 (HH) <0.03 ng/mL    Comment: CRITICAL RESULT CALLED TO, READ BACK BY AND VERIFIED WITH  CHARLI FLEETWOOD AT 1403 02/15/18 SDR Performed at [facility], [address] [place], [place] [address]     No results found.  ROS:  CONSTITUTIONAL: Denies fevers, chills. Denies any fatigue, weakness.  EYES: Denies blurry vision, double vision, eye pain. EARS, NOSE, THROAT: Denies tinnitus, ear pain, hearing loss. RESPIRATORY: Denies cough, wheeze, shortness of breath.  CARDIOVASCULAR: Denies chest pain, palpitations, edema.  GASTROINTESTINAL: Denies nausea, vomiting, diarrhea, abdominal pain. Denies bright red blood per rectum. GENITOURINARY: Denies dysuria, hematuria. ENDOCRINE: Denies nocturia or thyroid problems. HEMATOLOGIC AND LYMPHATIC: Denies easy bruising or bleeding. SKIN: Denies rash or lesion. MUSCULOSKELETAL: Denies pain in neck, back, shoulder, knees, hips or arthritic symptoms.  NEUROLOGIC: Denies paralysis, paresthesias.  PSYCHIATRIC: Denies anxiety or depressive symptoms. Blood pressure (!) 97/54, pulse 62, temperature 98 F (36.7 C), temperature source Oral, resp. rate 19, height 5\' 8"  (1.727 m), weight 90.1 kg, SpO2 100 %.   PHYSICAL EXAMINATION:  GENERAL: Well-nourished, well-developed  currently in no acute distress.   HEAD: Normocephalic, atraumatic.  EYES: Pupils equal, round, and reactive to light. Extraocular muscles intact. No scleral icterus.  MOUTH: Moist mucosal membranes. Dentition intact. No abscess noted. EARS, NOSE, THROAT: Clear without exudates. No external lesions.  NECK: Supple. No thyromegaly. No nodules. No JVD.  Right side of the neck with clean  incision of carotid endarterectomy PULMONARY: Clear to auscultation bilaterally without wheezes, rales, or rhonchi. No use of accessory muscles. Good respiratory effort. CHEST: Nontender to palpation.  CARDIOVASCULAR: S1, S2, regular rate and rhythm. No murmurs, rubs, or gallops.  GASTROINTESTINAL: Soft, nontender, nondistended. No masses. Positive bowel sounds. No hepatosplenomegaly. MUSCULOSKELETAL: No swelling, clubbing, edema. Range of motion full in all extremities. NEUROLOGIC: Cranial nerves II-XII intact. No gross focal neurological deficits. Sensation intact. Reflexes intact. SKIN: No ulcerations, lesions, rash, cyanosis. Skin warm, dry. Turgor intact. PSYCHIATRIC: Mood, affect within normal limits. Patient awake, alert, oriented x 3. Insight and judgment intact.   Assessment/Plan:  #Hypotension Patient's dopamine drip just discontinued Continue close monitoring of the blood pressure at this point Hold antihypertensives including Cozaar Will recommend to resume patient's antihypertensives at lower doses once patient's blood pressure improves  #History of stroke status post right carotid endarterectomy Postop care per vascular surgery  #GERD PPI  History of stroke Continue aspirin, Plavix 75 mg, statin   TOTAL TIME TAKING CARE OF THIS PATIENT: 42 minutes.   Note: This dictation was prepared with Dragon dictation along with smaller phrase technology. Any transcriptional errors that result from this process are unintentional.   @[facility] @ Pager - [phone] 02/15/2018, 4:18 PM

## 2018-02-15 NOTE — Progress Notes (Signed)
Patient alert and oriented. Vitals stable- patient walked around unit several times today- BP remained within normal limits.  Was able to wean patient off Dopamine drip around noon.  Patient has orders for discharge.  Patient given discharge instructions and prescriptions.  Waiting for family to come and transport patient home.

## 2018-02-15 NOTE — Discharge Instructions (Signed)
You may shower on Sunday. Keep incision clean and dry. Dermabond to fall off on its own.  No driving while on pain medication. No driving until cleared by your physician during follow up. No lifting heavier than 10 pounds. No strenuous activity until cleared by your physician. Please hold your Losartan. Please take your blood pressure 3-4 times a day. If you blood pressure starts to elevate to 120/80 please call your PCP / cardiologist in regard to restarting your Losartan.

## 2018-02-15 NOTE — Progress Notes (Signed)
Patient discharged with family.

## 2018-02-15 NOTE — Progress Notes (Addendum)
[facility] Daily Progress Note   Subjective: 2 Days Post-Op: Rightcarotid endarterectomy with CorMatrix arterial patch reconstruction  No issues overnight.  Patient without complaint this a.m.  The patient is still on dopamine.  Objective: Vitals:   02/15/18 0745 02/15/18 0800 02/15/18 0900 02/15/18 1100  BP: (!) 70/38 107/72 (!) 93/48 (!) 95/51  Pulse: 71 76 (!) 57 69  Resp: (!) 22 (!) 25 18 (!) 23  Temp: 98.1 F (36.7 C)     TempSrc: Oral     SpO2: 100% 99% 99% 100%  Weight:      Height:        Intake/Output Summary (Last 24 hours) at 02/15/2018 1205 Last data filed at 02/15/2018 1050 Gross per 24 hour  Intake 2013.98 ml  Output 150 ml  Net 1863.98 ml   Physical Exam: A&Ox3, NAD Face: Symmetrical, Tongue midline Neck: Minimal swelling. Incision - clean, dry and intact. Some ecchymosis. Trachea midline CV: RRR Pulmonary: CTA Bilaterally Abdomen: Soft, Nontender, Nondistended GU: foley has been removed Vascular: Warm, Non-tender, Minimal edema Neuro: 5/5 motor / sensory, intact upper / lower extremity   Laboratory: CBC    Component Value Date/Time   WBC 11.8 (H) 02/15/2018 0505   HGB 13.5 02/15/2018 0505   HCT 41.2 02/15/2018 0505   PLT 208 02/15/2018 0505   BMET    Component Value Date/Time   NA 140 02/15/2018 0505   K 3.8 02/15/2018 0505   CL 111 02/15/2018 0505   CO2 24 02/15/2018 0505   GLUCOSE 106 (H) 02/15/2018 0505   BUN 22 02/15/2018 0505   CREATININE 1.20 02/15/2018 0505   CALCIUM 8.5 (L) 02/15/2018 0505   GFRNONAA >60 02/15/2018 0505   GFRAA >60 02/15/2018 0505   Assessment/Planning: The patient is a 68 year old male s/p right carotid endarterectomy - POD #2 1) On Dopamine for hypotension, asymptomatic at this time, currently the dopamine is being weaned slowly and his pressure is stable 2) H&H stable 3) OOB / ambulate 4) Continue to try to wean pressors 5) Discharge when BP is stable and off pressors 6) Troponin to  rule out any cardiac event that may be contributing to hypotension 7) Will consult internal medicine to assist with managing antihypertensive medications on discharge / while in house.   Discussed with Dr. Ellis Parents Handy Mcloud PA-C 02/15/2018 12:05 PM

## 2018-02-15 NOTE — Discharge Summary (Signed)
[facility]    Discharge Summary  Patient ID:  Joel Ball. MRN: [id number] DOB/AGE: 70-Jun-1952 70 y.o.  Admit date: 02/13/2018 Discharge date: 02/15/2018 Date of Surgery: 02/13/2018 Surgeon: Surgeon(s): Lucky Cowboy Erskine Squibb, MD  Admission Diagnosis: CAROTID ARTERY STENOSIS  Discharge Diagnoses:  CAROTID ARTERY STENOSIS  Secondary Diagnoses: Past Medical History:  Diagnosis Date  . GERD (gastroesophageal reflux disease)   . Hypertension   . Stroke ([place])   . Tinnitus    Procedure(s): ENDARTERECTOMY CAROTID (RIGHT)  Discharged Condition: good  HPI:  The patient is a 68 year old male who presented with right eye symptoms from embolization and a right carotid stenosis of at least 70%.  On February 13, 2026 the patient underwent a Right carotid endarterectomy with CorMatrix arterial patch reconstruction.  The patient tolerated the procedure and was transferred to the ICU without complication.  The patient was briefly hypo-tensive post-operatively requiring dopamine.  The patient was weaned from the dopamine over 24 hours and upon discharge the patient was afebrile, his vital signs were stable, he was urinating independently and he was ambulating without complication.  The patient was tolerating a regular diet.  Hospital Course:  Joel Ball. is a 68 y.o. male is S/P Right:  Procedure(s): ENDARTERECTOMY CAROTID (RIGHT)  Extubated: POD # 0  Physical exam:  A&Ox3, NAD Face: Symmetrical, Tongue midline Neck: Minimal swelling. Incision - clean, dry and intact. Some ecchymosis. Trachea midline CV: RRR Pulmonary: CTA Bilaterally Abdomen: Soft, Nontender, Nondistended GU: foley has been removed Vascular:Warm, Non-tender, Minimal edema Neuro: 5/5 motor / sensory, intact upper / lower extremity    Post-op wounds clean, dry, intact or healing well  Pt. Ambulating, voiding and taking PO diet without difficulty.  Pt pain controlled with PO pain  meds.  Labs as below  Complications:none  Consults: None  Significant Diagnostic Studies: CBC Lab Results  Component Value Date   WBC 11.8 (H) 02/15/2018   HGB 13.5 02/15/2018   HCT 41.2 02/15/2018   MCV 94.3 02/15/2018   PLT 208 02/15/2018   BMET    Component Value Date/Time   NA 140 02/15/2018 0505   K 3.8 02/15/2018 0505   CL 111 02/15/2018 0505   CO2 24 02/15/2018 0505   GLUCOSE 106 (H) 02/15/2018 0505   BUN 22 02/15/2018 0505   CREATININE 1.20 02/15/2018 0505   CALCIUM 8.5 (L) 02/15/2018 0505   GFRNONAA >60 02/15/2018 0505   GFRAA >60 02/15/2018 0505   COAG Lab Results  Component Value Date   INR 0.97 02/07/2018   INR 0.97 09/20/2016   Disposition:  Discharge to :Home  Allergies as of 02/15/2018      Reactions   Statins    In high doses causes muscle pain      Medication List    TAKE these medications   aspirin EC 81 MG tablet Take 81 mg by mouth daily.   aspirin-acetaminophen-caffeine 250-250-65 MG tablet Commonly known as:  EXCEDRIN MIGRAINE Take 1 tablet by mouth daily as needed for headache.   clopidogrel 75 MG tablet Commonly known as:  PLAVIX Take 1 tablet (75 mg total) by mouth daily with breakfast.   ibuprofen 200 MG tablet Commonly known as:  ADVIL,MOTRIN Take 200 mg by mouth 2 (two) times daily as needed for moderate pain.   losartan 100 MG tablet Commonly known as:  COZAAR Take 100 mg by mouth daily.   magnesium oxide 400 MG tablet Commonly known as:  MAG-OX Take by mouth.  naproxen sodium 220 MG tablet Commonly known as:  ALEVE Take 220 mg by mouth 2 (two) times daily as needed (pain).   omeprazole 20 MG capsule Commonly known as:  PRILOSEC Take 20 mg by mouth every Monday, Wednesday, and Friday.   oxyCODONE-acetaminophen 5-325 MG tablet Commonly known as:  PERCOCET/ROXICET Take 1 tablet by mouth every 6 (six) hours as needed for moderate pain or severe pain.   PREPARATION H EX Apply 1 application topically daily  as needed (hemorrhoid flare).   rosuvastatin 5 MG tablet Commonly known as:  CRESTOR Take 5 mg by mouth daily at 6 PM.      Verbal and written Discharge instructions given to the patient. Wound care per Discharge AVS Follow-up Information    Dew, Erskine Squibb, MD Follow up in 1 week(s).   Specialties:  Vascular Surgery, Radiology, Interventional Cardiology Why:  Can see midlevel. First post-op. Incision check. No studies.  Contact information: [place] [address] [phone]          Signed: Sela Hua, PA-C  02/15/2018, 4:15 PM

## 2018-02-25 ENCOUNTER — Encounter (INDEPENDENT_AMBULATORY_CARE_PROVIDER_SITE_OTHER): Payer: Self-pay | Admitting: Vascular Surgery

## 2018-02-25 ENCOUNTER — Ambulatory Visit (INDEPENDENT_AMBULATORY_CARE_PROVIDER_SITE_OTHER): Payer: Medicare Other | Admitting: Vascular Surgery

## 2018-02-25 VITALS — BP 151/82 | HR 65 | Resp 16 | Ht 68.0 in | Wt 186.0 lb

## 2018-02-25 DIAGNOSIS — E785 Hyperlipidemia, unspecified: Secondary | ICD-10-CM

## 2018-02-25 DIAGNOSIS — I6523 Occlusion and stenosis of bilateral carotid arteries: Secondary | ICD-10-CM

## 2018-02-25 DIAGNOSIS — I10 Essential (primary) hypertension: Secondary | ICD-10-CM

## 2018-02-25 NOTE — Progress Notes (Signed)
Subjective:    Patient ID: Joel Red., male    DOB: 1950-10-09, 68 y.o.   MRN: [id number] Chief Complaint  Patient presents with  . Follow-up  . Wound Check    Patient presents for his first postoperative follow-up.  The patient is status post a right carotid endarterectomy on 02/13/18.  The patient presents without complaint.  The patient denies any amaurosis fugax or neurological deficits.  The patient has been in contact with his primary care physician and his antihypertensive medication has been restarted but at half the dose.  The patient denies any issues with his incision.  Denies any headache.  Patient denies any fever, nausea vomiting.  Review of Systems  Constitutional: Negative.   HENT: Negative.   Eyes: Negative.   Respiratory: Negative.   Cardiovascular: Negative.   Gastrointestinal: Negative.   Endocrine: Negative.   Genitourinary: Negative.   Musculoskeletal: Negative.   Skin: Negative.   Allergic/Immunologic: Negative.   Neurological: Negative.   Hematological: Negative.   Psychiatric/Behavioral: Negative.       Objective:   Physical Exam Vitals signs reviewed.  Constitutional:      Appearance: Normal appearance.  HENT:     Head: Normocephalic and atraumatic.     Right Ear: External ear normal.     Left Ear: External ear normal.     Nose: Nose normal.     Mouth/Throat:     Mouth: Mucous membranes are moist.     Pharynx: Oropharynx is clear.  Eyes:     Extraocular Movements: Extraocular movements intact.     Conjunctiva/sclera: Conjunctivae normal.     Pupils: Pupils are equal, round, and reactive to light.  Neck:     Musculoskeletal: Normal range of motion.     Comments: Right neck: Healing well.  No ecchymosis.  No erythema.  No drainage.  Minimal swelling. Cardiovascular:     Rate and Rhythm: Normal rate and regular rhythm.  Pulmonary:     Effort: Pulmonary effort is normal.     Breath sounds: Normal breath sounds.  Musculoskeletal: Normal  range of motion.        General: No swelling.  Skin:    General: Skin is warm and dry.  Neurological:     General: No focal deficit present.     Mental Status: He is alert and oriented to person, place, and time. Mental status is at baseline.  Psychiatric:        Mood and Affect: Mood normal.        Behavior: Behavior normal.        Thought Content: Thought content normal.        Judgment: Judgment normal.    BP (!) 151/82 (BP Location: Right Arm, Patient Position: Sitting, Cuff Size: Normal)   Pulse 65   Resp 16   Ht 5\' 8"  (1.727 m)   Wt 186 lb (84.4 kg)   BMI 28.28 kg/m    Past Medical History:  Diagnosis Date  . GERD (gastroesophageal reflux disease)   . Hypertension   . Stroke ([place])   . Tinnitus    Social History   Socioeconomic History  . Marital status: Married    Spouse name: Not on file  . Number of children: Not on file  . Years of education: Not on file  . Highest education level: Not on file  Occupational History  . Not on file  Social Needs  . Financial resource strain: Not on file  . Food insecurity:  Worry: Not on file    Inability: Not on file  . Transportation needs:    Medical: Not on file    Non-medical: Not on file  Tobacco Use  . Smoking status: Light Tobacco Smoker    Types: Cigars  . Smokeless tobacco: Never Used  Substance and Sexual Activity  . Alcohol use: Yes    Alcohol/week: 15.0 standard drinks    Types: 15 Shots of liquor per week    Comment: 2-3 oz of alcohol per day  . Drug use: No  . Sexual activity: Not on file  Lifestyle  . Physical activity:    Days per week: Not on file    Minutes per session: Not on file  . Stress: Not on file  Relationships  . Social connections:    Talks on phone: Not on file    Gets together: Not on file    Attends religious service: Not on file    Active member of club or organization: Not on file    Attends meetings of clubs or organizations: Not on file    Relationship status: Not on  file  . Intimate partner violence:    Fear of current or ex partner: Not on file    Emotionally abused: Not on file    Physically abused: Not on file    Forced sexual activity: Not on file  Other Topics Concern  . Not on file  Social History Narrative  . Not on file   Past Surgical History:  Procedure Laterality Date  . COLONOSCOPY WITH ESOPHAGOGASTRODUODENOSCOPY (EGD)    . COLONOSCOPY WITH PROPOFOL N/A 05/30/2016   Procedure: COLONOSCOPY WITH PROPOFOL;  Surgeon: Lollie Sails, MD;  Location: [facility] ENDOSCOPY;  Service: Endoscopy;  Laterality: N/A;  . ENDARTERECTOMY Left 09/27/2016   Procedure: ENDARTERECTOMY CAROTID;  Surgeon: Algernon Huxley, MD;  Location: [facility] ORS;  Service: Vascular;  Laterality: Left;  . ENDARTERECTOMY Right 02/13/2018   Procedure: ENDARTERECTOMY CAROTID;  Surgeon: Algernon Huxley, MD;  Location: [facility] ORS;  Service: Vascular;  Laterality: Right;   Family History  Problem Relation Age of Onset  . CAD Neg Hx    Allergies  Allergen Reactions  . Statins     In high doses causes muscle pain      Assessment & Plan:   Patient presents for his first postoperative follow-up.  The patient is status post a right carotid endarterectomy on 02/13/18.  The patient presents without complaint.  The patient denies any amaurosis fugax or neurological deficits.  The patient has been in contact with his primary care physician and his antihypertensive medication has been restarted but at half the dose.  The patient denies any issues with his incision.  Denies any headache.  Patient denies any fever, nausea vomiting.  1. Bilateral carotid artery stenosis - Stable The patient is status post a right carotid endarterectomy on February 13, 2018 The patient's postoperative course has been unremarkable Patient presents today without complaint. Patient's incision is healing well. The patient's antihypertensive medication is being monitored by his primary care physician The patient should  follow-up in 1 month to start his carotid duplex surveillance Patient was instructed to contact our office in the interim with problems such as bleeding, swelling from incision site, difficulty breathing, arm / leg weakness or numbness, speech / swallowing difficulty or temporary monocular blindness. The patient expresses their understanding.   - VAS US CAROTID; Future  2. Hyperlipidemia, unspecified hyperlipidemia type - Stable On ASA, Statin and Plavix  Encouraged good control as its slows the progression of atherosclerotic disease  3. Essential hypertension - Stable Encouraged good control as its slows the progression of atherosclerotic disease  Current Outpatient Medications on File Prior to Visit  Medication Sig Dispense Refill  . aspirin EC 81 MG tablet Take 81 mg by mouth daily.    Marland Kitchen aspirin-acetaminophen-caffeine (EXCEDRIN MIGRAINE) 250-250-65 MG tablet Take 1 tablet by mouth daily as needed for headache.    . clopidogrel (PLAVIX) 75 MG tablet Take 1 tablet (75 mg total) by mouth daily with breakfast. 90 tablet 1  . ibuprofen (ADVIL,MOTRIN) 200 MG tablet Take 200 mg by mouth 2 (two) times daily as needed for moderate pain.    Marland Kitchen losartan (COZAAR) 50 MG tablet Take 50 mg by mouth daily.    . naproxen sodium (ANAPROX) 220 MG tablet Take 220 mg by mouth 2 (two) times daily as needed (pain).    Marland Kitchen omeprazole (PRILOSEC) 20 MG capsule Take 20 mg by mouth every Monday, Wednesday, and Friday.     . rosuvastatin (CRESTOR) 5 MG tablet Take 5 mg by mouth daily at 6 PM.     . oxyCODONE-acetaminophen (PERCOCET/ROXICET) 5-325 MG tablet Take 1 tablet by mouth every 6 (six) hours as needed for moderate pain or severe pain. (Patient not taking: Reported on 02/25/2018) 10 tablet 0  . [place]el (PREPARATION H EX) Apply 1 application topically daily as needed (hemorrhoid flare).     No current facility-administered medications on file prior to visit.    There are no Patient Instructions on file for  this visit. No follow-ups on file.  Durell Lofaso A Jaelin Fackler, PA-C

## 2018-02-26 ENCOUNTER — Telehealth (INDEPENDENT_AMBULATORY_CARE_PROVIDER_SITE_OTHER): Payer: Self-pay | Admitting: Nurse Practitioner

## 2018-02-26 NOTE — Telephone Encounter (Signed)
Pt is aware and has no questions or concerns at this time  

## 2018-02-26 NOTE — Telephone Encounter (Signed)
What are his concerns about going to his dentist from a vascular standpoint?

## 2018-02-26 NOTE — Telephone Encounter (Signed)
Please advise on below  

## 2018-02-26 NOTE — Telephone Encounter (Signed)
Patient had a carotid endarterectomy on 02/13/2018 and now his dentist wants to know if he is cleared to have his teeth cleaned on 2/12 from a vascular standpoint. Please advise.

## 2018-02-26 NOTE — Telephone Encounter (Signed)
Basically His dental office told him to call here to see if it would be ok for him to have his teeth cleaned because he just had surgery.

## 2018-02-26 NOTE — Telephone Encounter (Signed)
He can have a cleaning as long as he can continue his blood thinner.  If he is required to stop that then it will need to be delayed.

## 2018-03-28 ENCOUNTER — Other Ambulatory Visit: Payer: Self-pay

## 2018-03-28 ENCOUNTER — Encounter (INDEPENDENT_AMBULATORY_CARE_PROVIDER_SITE_OTHER): Payer: Self-pay | Admitting: Nurse Practitioner

## 2018-03-28 ENCOUNTER — Ambulatory Visit (INDEPENDENT_AMBULATORY_CARE_PROVIDER_SITE_OTHER): Payer: Medicare Other | Admitting: Nurse Practitioner

## 2018-03-28 ENCOUNTER — Ambulatory Visit (INDEPENDENT_AMBULATORY_CARE_PROVIDER_SITE_OTHER): Payer: Medicare Other

## 2018-03-28 VITALS — BP 145/80 | HR 73 | Resp 10 | Ht 68.0 in | Wt 189.0 lb

## 2018-03-28 DIAGNOSIS — I6523 Occlusion and stenosis of bilateral carotid arteries: Secondary | ICD-10-CM

## 2018-03-28 DIAGNOSIS — Z7902 Long term (current) use of antithrombotics/antiplatelets: Secondary | ICD-10-CM

## 2018-03-28 DIAGNOSIS — Z7982 Long term (current) use of aspirin: Secondary | ICD-10-CM

## 2018-03-28 DIAGNOSIS — Z79899 Other long term (current) drug therapy: Secondary | ICD-10-CM

## 2018-03-28 DIAGNOSIS — K219 Gastro-esophageal reflux disease without esophagitis: Secondary | ICD-10-CM

## 2018-03-28 DIAGNOSIS — H34231 Retinal artery branch occlusion, right eye: Secondary | ICD-10-CM

## 2018-03-28 DIAGNOSIS — I10 Essential (primary) hypertension: Secondary | ICD-10-CM

## 2018-03-28 DIAGNOSIS — F17290 Nicotine dependence, other tobacco product, uncomplicated: Secondary | ICD-10-CM

## 2018-03-28 NOTE — Progress Notes (Signed)
SUBJECTIVE:  Patient ID: Joel Red., male    DOB: [birth year], 68 y.o.   MRN: [id number] Chief Complaint  Patient presents with  . Follow-up    HPI  Joel Heidelberg. is a 68 y.o. male The patient is seen for follow up evaluation of carotid stenosis status post right carotid endarterectomy on 02/13/2018. He underwent a left CEA on 09/27/2016. Prior to surgery the patient had amaurosis fugax of his right eye. Today he has 20/20 when he uses both eyes, but still has some narrowing of the vision field if he uses just his right.  There were no post operative problems or complications related to the surgery.  The patient denies neck or incisional pain.  The patient denies interval amaurosis fugax. There is no recent history of TIA symptoms or focal motor deficits. There is no prior documented CVA.  The patient denies headache.  The patient is taking enteric-coated aspirin 81 mg daily.  The patient has a history of coronary artery disease, no recent episodes of angina or shortness of breath. The patient denies PAD or claudication symptoms. There is a history of hyperlipidemia which is being treated with a statin.   The carotid ultrasound reveals that the patient has 1-39% stenosis bilaterally.   Past Medical History:  Diagnosis Date  . GERD (gastroesophageal reflux disease)   . Hypertension   . Stroke ([place])   . Tinnitus     Past Surgical History:  Procedure Laterality Date  . COLONOSCOPY WITH ESOPHAGOGASTRODUODENOSCOPY (EGD)    . COLONOSCOPY WITH PROPOFOL N/A 05/30/2016   Procedure: COLONOSCOPY WITH PROPOFOL;  Surgeon: Lollie Sails, MD;  Location: [facility] ENDOSCOPY;  Service: Endoscopy;  Laterality: N/A;  . ENDARTERECTOMY Left 09/27/2016   Procedure: ENDARTERECTOMY CAROTID;  Surgeon: Algernon Huxley, MD;  Location: [facility] ORS;  Service: Vascular;  Laterality: Left;  . ENDARTERECTOMY Right 02/13/2018   Procedure: ENDARTERECTOMY CAROTID;  Surgeon: Algernon Huxley, MD;  Location: [facility]  ORS;  Service: Vascular;  Laterality: Right;    Social History   Socioeconomic History  . Marital status: Married    Spouse name: Not on file  . Number of children: Not on file  . Years of education: Not on file  . Highest education level: Not on file  Occupational History  . Not on file  Social Needs  . Financial resource strain: Not on file  . Food insecurity:    Worry: Not on file    Inability: Not on file  . Transportation needs:    Medical: Not on file    Non-medical: Not on file  Tobacco Use  . Smoking status: Light Tobacco Smoker    Types: Cigars  . Smokeless tobacco: Never Used  . Tobacco comment: three times a year  Substance and Sexual Activity  . Alcohol use: Yes    Alcohol/week: 15.0 standard drinks    Types: 15 Shots of liquor per week    Comment: 2-3 oz of alcohol per day  . Drug use: No  . Sexual activity: Not on file  Lifestyle  . Physical activity:    Days per week: Not on file    Minutes per session: Not on file  . Stress: Not on file  Relationships  . Social connections:    Talks on phone: Not on file    Gets together: Not on file    Attends religious service: Not on file    Active member of club or organization: Not on file  Attends meetings of clubs or organizations: Not on file    Relationship status: Not on file  . Intimate partner violence:    Fear of current or ex partner: Not on file    Emotionally abused: Not on file    Physically abused: Not on file    Forced sexual activity: Not on file  Other Topics Concern  . Not on file  Social History Narrative  . Not on file    Family History  Problem Relation Age of Onset  . CAD Neg Hx     Allergies  Allergen Reactions  . Statins     In high doses causes muscle pain     Review of Systems   Review of Systems: Negative Unless Checked Constitutional: [] Weight loss  [] Fever  [] Chills Cardiac: [] Chest pain   []  Atrial Fibrillation  [] Palpitations   [] Shortness of breath when  laying flat   [] Shortness of breath with exertion. [] Shortness of breath at rest Vascular:  [] Pain in legs with walking   [] Pain in legs with standing [] Pain in legs when laying flat   [] Claudication    [] Pain in feet when laying flat    [] History of DVT   [] Phlebitis   [] Swelling in legs   [] Varicose veins   [] Non-healing ulcers Pulmonary:   [] Uses home oxygen   [] Productive cough   [] Hemoptysis   [] Wheeze  [] COPD   [] Asthma Neurologic:  [] Dizziness   [] Seizures  [] Blackouts [] History of stroke   [] History of TIA  [] Aphasia   [x] Temporary Blindness   [] Weakness or numbness in arm   [] Weakness or numbness in leg Musculoskeletal:   [] Joint swelling   [] Joint pain   [] Low back pain  []  History of Knee Replacement [] Arthritis [] back Surgeries  []  Spinal Stenosis    Hematologic:  [] Easy bruising  [] Easy bleeding   [] Hypercoagulable state   [] Anemic Gastrointestinal:  [] Diarrhea   [] Vomiting  [] Gastroesophageal reflux/heartburn   [] Difficulty swallowing. [] Abdominal pain Genitourinary:  [] Chronic kidney disease   [] Difficult urination  [] Anuric   [] Blood in urine [] Frequent urination  [] Burning with urination   [] Hematuria Skin:  [] Rashes   [] Ulcers [] Wounds Psychological:  [] History of anxiety   []  History of major depression  []  Memory Difficulties      OBJECTIVE:   Physical Exam  BP (!) 145/80 (BP Location: Left Arm, Patient Position: Sitting, Cuff Size: Small)   Pulse 73   Resp 10   Ht 5\' 8"  (1.727 m)   Wt 189 lb (85.7 kg)   BMI 28.74 kg/m   Gen: WD/WN, NAD Head: [place]/AT, No temporalis wasting.  Ear/Nose/Throat: Hearing grossly intact, nares w/o erythema or drainage Eyes: PER, EOMI, sclera nonicteric.  Neck: Supple, no masses.  No JVD.  Pulmonary:  Good air movement, no use of accessory muscles.  Cardiac: RRR Vascular:  Vessel Right Left  Radial Palpable Palpable  Gastrointestinal: soft, non-distended. No guarding/no peritoneal signs.  Musculoskeletal: M/S 5/5 throughout.  No  deformity or atrophy.  Neurologic: Pain and light touch intact in extremities.  Symmetrical.  Speech is fluent. Motor exam as listed above. Psychiatric: Judgment intact, Mood & affect appropriate for pt's clinical situation. Dermatologic: No Venous rashes. No Ulcers Noted.  No changes consistent with cellulitis. Lymph : No Cervical lymphadenopathy, no lichenification or skin changes of chronic lymphedema.       ASSESSMENT AND PLAN:  1. Bilateral carotid artery stenosis Recommend:  The patient is s/p successful right CEA  Duplex ultrasound preoperatively shows no contralateral stenosis.  Continue antiplatelet therapy as prescribed Continue management of CAD, HTN and Hyperlipidemia Healthy heart diet,  encouraged exercise at least 4 times per week  Follow up in 6 months with duplex ultrasound and physical exam based on the patient's carotid surgery and bilateral stenosis of the 1-39% carotid artery    2. Retinal artery occlusion, branch, right Mostly resolved.  Will continue care with opthamology  3. Essential hypertension Continue antihypertensive medications as already ordered, these medications have been reviewed and there are no changes at this time.   4. Gastroesophageal reflux disease without esophagitis Continue PPI as already ordered, this medication has been reviewed and there are no changes at this time.  Avoidence of caffeine and alcohol  Moderate elevation of the head of the bed    Current Outpatient Medications on File Prior to Visit  Medication Sig Dispense Refill  . aspirin EC 81 MG tablet Take 81 mg by mouth daily.    Marland Kitchen aspirin-acetaminophen-caffeine (EXCEDRIN MIGRAINE) 250-250-65 MG tablet Take 1 tablet by mouth daily as needed for headache.    . clopidogrel (PLAVIX) 75 MG tablet Take 1 tablet (75 mg total) by mouth daily with breakfast. 90 tablet 1  . ibuprofen (ADVIL,MOTRIN) 200 MG tablet Take 200 mg by mouth 2 (two) times daily as needed for moderate  pain.    Marland Kitchen losartan (COZAAR) 50 MG tablet Take 100 mg by mouth daily.     . naproxen sodium (ANAPROX) 220 MG tablet Take 220 mg by mouth 2 (two) times daily as needed (pain).    Marland Kitchen omeprazole (PRILOSEC) 20 MG capsule Take 20 mg by mouth every Monday, Wednesday, and Friday.     . rosuvastatin (CRESTOR) 5 MG tablet Take 5 mg by mouth daily at 6 PM.     . Witch Hazel (PREPARATION H EX) Apply 1 application topically daily as needed (hemorrhoid flare).    Marland Kitchen oxyCODONE-acetaminophen (PERCOCET/ROXICET) 5-325 MG tablet Take 1 tablet by mouth every 6 (six) hours as needed for moderate pain or severe pain. (Patient not taking: Reported on 02/25/2018) 10 tablet 0   No current facility-administered medications on file prior to visit.     There are no Patient Instructions on file for this visit. No follow-ups on file.   Kris Hartmann, NP  This note was completed with Sales executive.  Any errors are purely unintentional.

## 2018-06-07 ENCOUNTER — Ambulatory Visit (INDEPENDENT_AMBULATORY_CARE_PROVIDER_SITE_OTHER): Payer: Medicare Other | Admitting: Vascular Surgery

## 2018-06-07 ENCOUNTER — Other Ambulatory Visit: Payer: Self-pay

## 2018-06-07 ENCOUNTER — Encounter (INDEPENDENT_AMBULATORY_CARE_PROVIDER_SITE_OTHER): Payer: Self-pay | Admitting: Vascular Surgery

## 2018-06-07 ENCOUNTER — Ambulatory Visit (INDEPENDENT_AMBULATORY_CARE_PROVIDER_SITE_OTHER): Payer: Medicare Other

## 2018-06-07 VITALS — BP 114/71 | HR 66 | Resp 10 | Ht 68.0 in | Wt 184.0 lb

## 2018-06-07 DIAGNOSIS — Z79899 Other long term (current) drug therapy: Secondary | ICD-10-CM | POA: Diagnosis not present

## 2018-06-07 DIAGNOSIS — E785 Hyperlipidemia, unspecified: Secondary | ICD-10-CM | POA: Diagnosis not present

## 2018-06-07 DIAGNOSIS — Z7902 Long term (current) use of antithrombotics/antiplatelets: Secondary | ICD-10-CM

## 2018-06-07 DIAGNOSIS — I6523 Occlusion and stenosis of bilateral carotid arteries: Secondary | ICD-10-CM

## 2018-06-07 DIAGNOSIS — I10 Essential (primary) hypertension: Secondary | ICD-10-CM

## 2018-06-07 NOTE — Assessment & Plan Note (Signed)
Carotid duplex today show patent right carotid endarterectomy and left carotid endarterectomy sites with no significant recurrent or residual stenosis. Overall doing well.  We discussed continuing Plavix unless he has bruising or other symptoms.  Return in 6 months with follow-up duplex or sooner if problems develop in the interim.

## 2018-06-07 NOTE — Progress Notes (Signed)
MRN : [id number]  Joel Ball. is a 68 y.o. (May 29, 1950) male who presents with chief complaint of  Chief Complaint  Patient presents with  . Follow-up  .  History of Present Illness: Patient returns in follow-up of his carotid disease.  He is status post bilateral carotid endarterectomies.  His left was about 2 years ago and his right was about 4 months ago.  He has had no issues since his last visit.  No specific complaints today.  He has resumed all normal activities.  Carotid duplex today show patent right carotid endarterectomy and left carotid endarterectomy sites with no significant recurrent or residual stenosis.  Current Outpatient Medications  Medication Sig Dispense Refill  . aspirin EC 81 MG tablet Take 81 mg by mouth daily.    Marland Kitchen aspirin-acetaminophen-caffeine (EXCEDRIN MIGRAINE) 250-250-65 MG tablet Take 1 tablet by mouth daily as needed for headache.    . clopidogrel (PLAVIX) 75 MG tablet Take 1 tablet (75 mg total) by mouth daily with breakfast. 90 tablet 1  . ibuprofen (ADVIL,MOTRIN) 200 MG tablet Take 200 mg by mouth 2 (two) times daily as needed for moderate pain.    Marland Kitchen losartan (COZAAR) 50 MG tablet Take 100 mg by mouth daily.     . naproxen sodium (ANAPROX) 220 MG tablet Take 220 mg by mouth 2 (two) times daily as needed (pain).    Marland Kitchen omeprazole (PRILOSEC) 20 MG capsule Take 20 mg by mouth every Monday, Wednesday, and Friday.     . rosuvastatin (CRESTOR) 5 MG tablet Take 5 mg by mouth daily at 6 PM.     . Witch Hazel (PREPARATION H EX) Apply 1 application topically daily as needed (hemorrhoid flare).    Marland Kitchen oxyCODONE-acetaminophen (PERCOCET/ROXICET) 5-325 MG tablet Take 1 tablet by mouth every 6 (six) hours as needed for moderate pain or severe pain. (Patient not taking: Reported on 02/25/2018) 10 tablet 0   No current facility-administered medications for this visit.     Past Medical History:  Diagnosis Date  . GERD (gastroesophageal reflux disease)   .  Hypertension   . Stroke ([place])   . Tinnitus     Past Surgical History:  Procedure Laterality Date  . COLONOSCOPY WITH ESOPHAGOGASTRODUODENOSCOPY (EGD)    . COLONOSCOPY WITH PROPOFOL N/A 05/30/2016   Procedure: COLONOSCOPY WITH PROPOFOL;  Surgeon: Lollie Sails, MD;  Location: [facility] ENDOSCOPY;  Service: Endoscopy;  Laterality: N/A;  . ENDARTERECTOMY Left 09/27/2016   Procedure: ENDARTERECTOMY CAROTID;  Surgeon: Algernon Huxley, MD;  Location: [facility] ORS;  Service: Vascular;  Laterality: Left;  . ENDARTERECTOMY Right 02/13/2018   Procedure: ENDARTERECTOMY CAROTID;  Surgeon: Algernon Huxley, MD;  Location: [facility] ORS;  Service: Vascular;  Laterality: Right;   Social History        Tobacco Use  . Smoking status: Never Smoker  . Smokeless tobacco: Never Used  Substance Use Topics  . Alcohol use: Yes    Alcohol/week: 9.0 oz    Types: 15 Shots of liquor per week    Comment: 2-3 oz of alcohol per day  . Drug use: No     Family History      Family History  Problem Relation Age of Onset  . CAD Neg Hx   No bleeding disorders, clotting disorders, autoimmune diseases, or aneurysms        Allergies  Allergen Reactions  . Statins     In high doses causes muscle pain      REVIEW OF  SYSTEMS(Negative unless checked)  Constitutional: [] ??Weight loss[] ??Fever[] ??Chills Cardiac:[] ??Chest pain[] ??Chest pressure[] ??Palpitations [] ??Shortness of breath when laying flat [] ??Shortness of breath at rest [] ??Shortness of breath with exertion. Vascular: [] ??Pain in legs with walking[] ??Pain in legsat rest[] ??Pain in legs when laying flat [] ??Claudication [] ??Pain in feet when walking [] ??Pain in feet at rest [] ??Pain in feet when laying flat [] ??History of DVT [] ??Phlebitis [] ??Swelling in legs [] ??Varicose veins [] ??Non-healing ulcers Pulmonary: [] ??Uses home oxygen [] ??Productive cough[] ??Hemoptysis [] ??Wheeze [] ??COPD  [] ??Asthma Neurologic: [] ??Dizziness [] ??Blackouts [] ??Seizures [] ??History of stroke [] ??History of TIA[] ??Aphasia [x] ??Temporary blindness[] ??Dysphagia [] ??Weaknessor numbness in arms [] ??Weakness or numbnessin legs Musculoskeletal: [] ??Arthritis [] ??Joint swelling [] ??Joint pain [] ??Low back pain Hematologic:[] ??Easy bruising[] ??Easy bleeding [] ??Hypercoagulable state [] ??Anemic [] ??Hepatitis Gastrointestinal:[] ??Blood in stool[] ??Vomiting blood[x] ??Gastroesophageal reflux/heartburn[] ??Abdominal pain Genitourinary: [x] ??Chronic kidney disease [] ??Difficulturination [] ??Frequenturination [] ??Burning with urination[] ??Hematuria Skin: [] ??Rashes [] ??Ulcers [] ??Wounds Psychological: [] ??History of anxiety[] ??History of major depression.    Physical Examination  Vitals:   06/07/18 1005  BP: 114/71  Pulse: 66  Resp: 10  Weight: 184 lb (83.5 kg)  Height: 5\' 8"  (1.727 m)   Body mass index is 27.98 kg/m. Gen:  WD/WN, NAD.  Appears younger than stated age Head: [place]/AT, No temporalis wasting. Ear/Nose/Throat: Hearing grossly intact, nares w/o erythema or drainage, trachea midline Eyes: Conjunctiva clear. Sclera non-icteric Neck: Supple.  No bruit  Pulmonary:  Good air movement, equal and clear to auscultation bilaterally.  Cardiac: RRR, No JVD Vascular:  Vessel Right Left  Radial Palpable Palpable               Musculoskeletal: M/S 5/5 throughout.  No deformity or atrophy.  No edema. Neurologic: CN 2-12 intact. Sensation grossly intact in extremities.  Symmetrical.  Speech is fluent. Motor exam as listed above. Psychiatric: Judgment intact, Mood & affect appropriate for pt's clinical situation. Dermatologic: No rashes or ulcers noted.  No cellulitis or open wounds. Lymph : No Cervical, Axillary lymphadenopathy.     CBC Lab Results  Component Value Date   WBC 11.8 (H) 02/15/2018   HGB 13.5 02/15/2018   HCT  41.2 02/15/2018   MCV 94.3 02/15/2018   PLT 208 02/15/2018    BMET    Component Value Date/Time   NA 140 02/15/2018 0505   K 3.8 02/15/2018 0505   CL 111 02/15/2018 0505   CO2 24 02/15/2018 0505   GLUCOSE 106 (H) 02/15/2018 0505   BUN 22 02/15/2018 0505   CREATININE 1.20 02/15/2018 0505   CALCIUM 8.5 (L) 02/15/2018 0505   GFRNONAA >60 02/15/2018 0505   GFRAA >60 02/15/2018 0505   CrCl cannot be calculated (Patient's most recent lab result is older than the maximum 21 days allowed.).  COAG Lab Results  Component Value Date   INR 0.97 02/07/2018   INR 0.97 09/20/2016    Radiology No results found.   Assessment/Plan Hypertension blood pressure control important in reducing the progression of atherosclerotic disease. On appropriate oral medications.   Hyperlipidemia lipid control important in reducing the progression of atherosclerotic disease. Continue statin therapy  Carotid artery stenosis Carotid duplex today show patent right carotid endarterectomy and left carotid endarterectomy sites with no significant recurrent or residual stenosis. Overall doing well.  We discussed continuing Plavix unless he has bruising or other symptoms.  Return in 6 months with follow-up duplex or sooner if problems develop in the interim.    Leotis Pain, MD  06/07/2018 11:56 AM    This note was created with Dragon medical transcription system.  Any errors from dictation are purely unintentional

## 2018-07-31 ENCOUNTER — Other Ambulatory Visit (INDEPENDENT_AMBULATORY_CARE_PROVIDER_SITE_OTHER): Payer: Self-pay | Admitting: Vascular Surgery

## 2018-09-30 ENCOUNTER — Encounter (INDEPENDENT_AMBULATORY_CARE_PROVIDER_SITE_OTHER): Payer: Medicare Other

## 2018-09-30 ENCOUNTER — Ambulatory Visit (INDEPENDENT_AMBULATORY_CARE_PROVIDER_SITE_OTHER): Payer: Medicare Other | Admitting: Nurse Practitioner

## 2018-12-10 ENCOUNTER — Ambulatory Visit (INDEPENDENT_AMBULATORY_CARE_PROVIDER_SITE_OTHER): Payer: Medicare Other

## 2018-12-10 ENCOUNTER — Encounter (INDEPENDENT_AMBULATORY_CARE_PROVIDER_SITE_OTHER): Payer: Self-pay | Admitting: Vascular Surgery

## 2018-12-10 ENCOUNTER — Other Ambulatory Visit: Payer: Self-pay

## 2018-12-10 ENCOUNTER — Ambulatory Visit (INDEPENDENT_AMBULATORY_CARE_PROVIDER_SITE_OTHER): Payer: Medicare Other | Admitting: Vascular Surgery

## 2018-12-10 VITALS — BP 133/80 | HR 68 | Resp 18 | Ht 68.0 in | Wt 188.0 lb

## 2018-12-10 DIAGNOSIS — I6523 Occlusion and stenosis of bilateral carotid arteries: Secondary | ICD-10-CM

## 2018-12-10 DIAGNOSIS — I10 Essential (primary) hypertension: Secondary | ICD-10-CM

## 2018-12-10 DIAGNOSIS — E785 Hyperlipidemia, unspecified: Secondary | ICD-10-CM | POA: Diagnosis not present

## 2018-12-10 NOTE — Progress Notes (Signed)
MRN : [id number]  Joel Ball. is a 68 y.o. (06-10-1950) male who presents with chief complaint of  Chief Complaint  Patient presents with  . Follow-up  .  History of Present Illness: Patient returns in follow-up of his carotid disease.  He has now undergone bilateral carotid endarterectomies.  The right was earlier this year and the left was in 2018.  He is doing well today.  He has no complaints.  He denies any focal neurologic symptoms. Specifically, the patient denies amaurosis fugax, speech or swallowing difficulties, or arm or leg weakness or numbness.  Carotid duplex today shows no significant recurrent stenosis with patent endarterectomies bilaterally.  Current Outpatient Medications  Medication Sig Dispense Refill  . aspirin EC 81 MG tablet Take 81 mg by mouth daily.    Marland Kitchen aspirin-acetaminophen-caffeine (EXCEDRIN MIGRAINE) 250-250-65 MG tablet Take 1 tablet by mouth daily as needed for headache.    . clopidogrel (PLAVIX) 75 MG tablet TAKE 1 TABLET BY MOUTH DAILY WITH BREAKFAST 90 tablet 1  . ibuprofen (ADVIL,MOTRIN) 200 MG tablet Take 200 mg by mouth 2 (two) times daily as needed for moderate pain.    Marland Kitchen losartan (COZAAR) 50 MG tablet Take 100 mg by mouth daily.     . naproxen sodium (ANAPROX) 220 MG tablet Take 220 mg by mouth 2 (two) times daily as needed (pain).    Marland Kitchen omeprazole (PRILOSEC) 20 MG capsule Take 20 mg by mouth every Monday, Wednesday, and Friday.     . rosuvastatin (CRESTOR) 5 MG tablet Take 5 mg by mouth daily at 6 PM.     . telmisartan (MICARDIS) 80 MG tablet Take by mouth.    Addison Lank Hazel (PREPARATION H EX) Apply 1 application topically daily as needed (hemorrhoid flare).    Marland Kitchen oxyCODONE-acetaminophen (PERCOCET/ROXICET) 5-325 MG tablet Take 1 tablet by mouth every 6 (six) hours as needed for moderate pain or severe pain. (Patient not taking: Reported on 12/10/2018) 10 tablet 0   No current facility-administered medications for this visit.     Past Medical  History:  Diagnosis Date  . GERD (gastroesophageal reflux disease)   . Hypertension   . Stroke ([place])   . Tinnitus     Past Surgical History:  Procedure Laterality Date  . COLONOSCOPY WITH ESOPHAGOGASTRODUODENOSCOPY (EGD)    . COLONOSCOPY WITH PROPOFOL N/A 05/30/2016   Procedure: COLONOSCOPY WITH PROPOFOL;  Surgeon: Lollie Sails, MD;  Location: [facility] ENDOSCOPY;  Service: Endoscopy;  Laterality: N/A;  . ENDARTERECTOMY Left 09/27/2016   Procedure: ENDARTERECTOMY CAROTID;  Surgeon: Algernon Huxley, MD;  Location: [facility] ORS;  Service: Vascular;  Laterality: Left;  . ENDARTERECTOMY Right 02/13/2018   Procedure: ENDARTERECTOMY CAROTID;  Surgeon: Algernon Huxley, MD;  Location: [facility] ORS;  Service: Vascular;  Laterality: Right;    Social History        Tobacco Use  . Smoking status: Never Smoker  . Smokeless tobacco: Never Used  Substance Use Topics  . Alcohol use: Yes    Alcohol/week: 9.0 oz    Types: 15 Shots of liquor per week    Comment: 2-3 oz of alcohol per day  . Drug use: No     Family History      Family History  Problem Relation Age of Onset  . CAD Neg Hx   No bleeding disorders, clotting disorders, autoimmune diseases, or aneurysms        Allergies  Allergen Reactions  . Statins     In  high doses causes muscle pain      REVIEW OF SYSTEMS(Negative unless checked)  Constitutional: [] ???Weight loss[] ???Fever[] ???Chills Cardiac:[] ???Chest pain[] ???Chest pressure[] ???Palpitations [] ???Shortness of breath when laying flat [] ???Shortness of breath at rest [] ???Shortness of breath with exertion. Vascular: [] ???Pain in legs with walking[] ???Pain in legsat rest[] ???Pain in legs when laying flat [] ???Claudication [] ???Pain in feet when walking [] ???Pain in feet at rest [] ???Pain in feet when laying flat [] ???History of DVT [] ???Phlebitis [] ???Swelling in legs [] ???Varicose veins [] ???Non-healing ulcers  Pulmonary: [] ???Uses home oxygen [] ???Productive cough[] ???Hemoptysis [] ???Wheeze [] ???COPD [] ???Asthma Neurologic: [] ???Dizziness [] ???Blackouts [] ???Seizures [] ???History of stroke [] ???History of TIA[] ???Aphasia [x] ???Temporary blindness[] ???Dysphagia [] ???Weaknessor numbness in arms [] ???Weakness or numbnessin legs Musculoskeletal: [] ???Arthritis [] ???Joint swelling [] ???Joint pain [] ???Low back pain Hematologic:[] ???Easy bruising[] ???Easy bleeding [] ???Hypercoagulable state [] ???Anemic [] ???Hepatitis Gastrointestinal:[] ???Blood in stool[] ???Vomiting blood[x] ???Gastroesophageal reflux/heartburn[] ???Abdominal pain Genitourinary: [x] ???Chronic kidney disease [] ???Difficulturination [] ???Frequenturination [] ???Burning with urination[] ???Hematuria Skin: [] ???Rashes [] ???Ulcers [] ???Wounds Psychological: [] ???History of anxiety[] ???History of major depression.     Physical Examination  Vitals:   12/10/18 1114  BP: 133/80  Pulse: 68  Resp: 18  Weight: 188 lb (85.3 kg)  Height: 5\' 8"  (1.727 m)   Body mass index is 28.59 kg/m. Gen:  WD/WN, NAD Head: [place]/AT, No temporalis wasting. Ear/Nose/Throat: Hearing grossly intact, nares w/o erythema or drainage, trachea midline Eyes: Conjunctiva clear. Sclera non-icteric Neck: Supple.  No bruit  Pulmonary:  Good air movement, equal and clear to auscultation bilaterally.  Cardiac: RRR, No JVD Vascular:  Vessel Right Left  Radial Palpable Palpable               Musculoskeletal: M/S 5/5 throughout.  No deformity or atrophy. no edema. Neurologic: CN 2-12 intact. Sensation grossly intact in extremities.  Symmetrical.  Speech is fluent. Motor exam as listed above. Psychiatric: Judgment intact, Mood & affect appropriate for pt's clinical situation. Dermatologic: No rashes or ulcers noted.  No cellulitis or open wounds.      CBC Lab Results  Component Value Date    WBC 11.8 (H) 02/15/2018   HGB 13.5 02/15/2018   HCT 41.2 02/15/2018   MCV 94.3 02/15/2018   PLT 208 02/15/2018    BMET    Component Value Date/Time   NA 140 02/15/2018 0505   K 3.8 02/15/2018 0505   CL 111 02/15/2018 0505   CO2 24 02/15/2018 0505   GLUCOSE 106 (H) 02/15/2018 0505   BUN 22 02/15/2018 0505   CREATININE 1.20 02/15/2018 0505   CALCIUM 8.5 (L) 02/15/2018 0505   GFRNONAA >60 02/15/2018 0505   GFRAA >60 02/15/2018 0505   CrCl cannot be calculated (Patient's most recent lab result is older than the maximum 21 days allowed.).  COAG Lab Results  Component Value Date   INR 0.97 02/07/2018   INR 0.97 09/20/2016    Radiology No results found.   Assessment/Plan Hypertension blood pressure control important in reducing the progression of atherosclerotic disease. On appropriate oral medications.   Hyperlipidemia lipid control important in reducing the progression of atherosclerotic disease. Continue statin therapy  Carotid stenosis Carotid duplex today shows no significant recurrent stenosis with patent endarterectomies bilaterally. Overall doing well.  Continue current medical regimen.  We will do another 3-month check to get him beyond 1 year from his most recent carotid endarterectomy then we will plan to move to an annual visit.    Leotis Pain, MD  12/10/2018 1:01 PM    This note was created with Dragon medical transcription system.  Any errors from dictation are purely unintentional

## 2018-12-10 NOTE — Assessment & Plan Note (Signed)
Carotid duplex today shows no significant recurrent stenosis with patent endarterectomies bilaterally. Overall doing well.  Continue current medical regimen.  We will do another 71-month check to get him beyond 1 year from his most recent carotid endarterectomy then we will plan to move to an annual visit.

## 2019-01-17 ENCOUNTER — Other Ambulatory Visit (INDEPENDENT_AMBULATORY_CARE_PROVIDER_SITE_OTHER): Payer: Self-pay | Admitting: Vascular Surgery

## 2019-06-10 ENCOUNTER — Encounter (INDEPENDENT_AMBULATORY_CARE_PROVIDER_SITE_OTHER): Payer: Self-pay | Admitting: Vascular Surgery

## 2019-06-10 ENCOUNTER — Ambulatory Visit (INDEPENDENT_AMBULATORY_CARE_PROVIDER_SITE_OTHER): Payer: Medicare Other

## 2019-06-10 ENCOUNTER — Other Ambulatory Visit: Payer: Self-pay

## 2019-06-10 ENCOUNTER — Ambulatory Visit (INDEPENDENT_AMBULATORY_CARE_PROVIDER_SITE_OTHER): Payer: Medicare Other | Admitting: Nurse Practitioner

## 2019-06-10 VITALS — BP 128/72 | HR 66 | Ht 68.0 in | Wt 185.0 lb

## 2019-06-10 DIAGNOSIS — I10 Essential (primary) hypertension: Secondary | ICD-10-CM

## 2019-06-10 DIAGNOSIS — E7800 Pure hypercholesterolemia, unspecified: Secondary | ICD-10-CM

## 2019-06-10 DIAGNOSIS — H34231 Retinal artery branch occlusion, right eye: Secondary | ICD-10-CM

## 2019-06-10 DIAGNOSIS — I6523 Occlusion and stenosis of bilateral carotid arteries: Secondary | ICD-10-CM

## 2019-06-12 ENCOUNTER — Encounter (INDEPENDENT_AMBULATORY_CARE_PROVIDER_SITE_OTHER): Payer: Self-pay | Admitting: Nurse Practitioner

## 2019-06-12 NOTE — Progress Notes (Signed)
Subjective:    Patient ID: Joel Red., male    DOB: [birth year], 69 y.o.   MRN: [id number] Chief Complaint  Patient presents with  . Follow-up    U/S follow up    The patient is seen for follow up evaluation of carotid stenosis. The carotid stenosis followed by ultrasound.  The patient had a left carotid endarterectomy on 09/27/2016 followed by a right carotid endarterectomy on 02/13/2018.  The right carotid endarterectomy was led by retinal artery occlusion.  Today, the patient states that when he is using both eyes he has no vision issues.  However there is still some blind spots if he is always looking out of 1 eye.  The patient also reports having some back issues in March that have largely resolved.  The patient also reports having some dizziness when he first gets up from a sitting to standing position or tries to rapidly walk or change position.  He is not lost consciousness or had any falls. .  The patient is taking enteric-coated aspirin 81 mg daily.  There is no history of migraine headaches. There is no history of seizures.  The patient has had no recent episodes of angina or shortness of breath. The patient denies PAD or claudication symptoms. There is a history of hyperlipidemia which is being treated with a statin.    Carotid Duplex done today shows minimal wall thickening or plaque in the bilateral internal carotid arteries    Review of Systems  Musculoskeletal: Positive for back pain.  Neurological: Positive for dizziness.  All other systems reviewed and are negative.      Objective:   Physical Exam Vitals reviewed.  Neck:     Vascular: No carotid bruit.  Pulmonary:     Effort: Pulmonary effort is normal.     Breath sounds: Normal breath sounds.  Neurological:     Mental Status: He is alert and oriented to person, place, and time.  Psychiatric:        Mood and Affect: Mood normal.        Behavior: Behavior normal.        Thought Content: Thought  content normal.        Judgment: Judgment normal.     BP 128/72   Pulse 66   Ht 5\' 8"  (1.727 m)   Wt 185 lb (83.9 kg)   BMI 28.13 kg/m   Past Medical History:  Diagnosis Date  . GERD (gastroesophageal reflux disease)   . Hypertension   . Stroke ([place])   . Tinnitus     Social History   Socioeconomic History  . Marital status: Married    Spouse name: Not on file  . Number of children: Not on file  . Years of education: Not on file  . Highest education level: Not on file  Occupational History  . Not on file  Tobacco Use  . Smoking status: Light Tobacco Smoker    Types: Cigars  . Smokeless tobacco: Never Used  . Tobacco comment: three times a year  Substance and Sexual Activity  . Alcohol use: Yes    Alcohol/week: 15.0 standard drinks    Types: 15 Shots of liquor per week    Comment: 2-3 oz of alcohol per day  . Drug use: No  . Sexual activity: Not on file  Other Topics Concern  . Not on file  Social History Narrative  . Not on file   Social Determinants of Health   Financial Resource  Strain:   . Difficulty of Paying Living Expenses:   Food Insecurity:   . Worried About Charity fundraiser in the Last Year:   . Arboriculturist in the Last Year:   Transportation Needs:   . Film/video editor (Medical):   Marland Kitchen Lack of Transportation (Non-Medical):   Physical Activity:   . Days of Exercise per Week:   . Minutes of Exercise per Session:   Stress:   . Feeling of Stress :   Social Connections:   . Frequency of Communication with Friends and Family:   . Frequency of Social Gatherings with Friends and Family:   . Attends Religious Services:   . Active Member of Clubs or Organizations:   . Attends Archivist Meetings:   Marland Kitchen Marital Status:   Intimate Partner Violence:   . Fear of Current or Ex-Partner:   . Emotionally Abused:   Marland Kitchen Physically Abused:   . Sexually Abused:     Past Surgical History:  Procedure Laterality Date  . COLONOSCOPY WITH  ESOPHAGOGASTRODUODENOSCOPY (EGD)    . COLONOSCOPY WITH PROPOFOL N/A 05/30/2016   Procedure: COLONOSCOPY WITH PROPOFOL;  Surgeon: Lollie Sails, MD;  Location: [facility] ENDOSCOPY;  Service: Endoscopy;  Laterality: N/A;  . ENDARTERECTOMY Left 09/27/2016   Procedure: ENDARTERECTOMY CAROTID;  Surgeon: Algernon Huxley, MD;  Location: [facility] ORS;  Service: Vascular;  Laterality: Left;  . ENDARTERECTOMY Right 02/13/2018   Procedure: ENDARTERECTOMY CAROTID;  Surgeon: Algernon Huxley, MD;  Location: [facility] ORS;  Service: Vascular;  Laterality: Right;    Family History  Problem Relation Age of Onset  . CAD Neg Hx     Allergies  Allergen Reactions  . Statins     In high doses causes muscle pain  . Tizanidine Other (See Comments)       Assessment & Plan:   1. Bilateral carotid artery stenosis Recommend:  Given the patient's asymptomatic subcritical stenosis no further invasive testing or surgery at this time.  Duplex ultrasound shows minimal wall thickening  bilaterally.  Bilateral endarterectomy sites are widely patent.  Continue antiplatelet therapy as prescribed Continue management of CAD, HTN and Hyperlipidemia Healthy heart diet,  encouraged exercise at least 4 times per week Follow up in 12 months with duplex ultrasound and physical exam   The patient's dizziness seems to be more of a component of orthostatic hypotension.  Patient will follow up with PCP and/or cardiologist.  2. Retinal artery occlusion, branch, right Patient's vision has improved.  Patient will continue with dual antiplatelet therapy as outlined above.  We will continue with follow-up visits with ophthalmology.  3. Essential hypertension Continue antihypertensive medications as already ordered, these medications have been reviewed and there are no changes at this time.   4. Pure hypercholesterolemia Continue statin as ordered and reviewed, no changes at this time    Current Outpatient Medications on File Prior to Visit   Medication Sig Dispense Refill  . aspirin EC 81 MG tablet Take 81 mg by mouth daily.    Marland Kitchen aspirin-acetaminophen-caffeine (EXCEDRIN MIGRAINE) 250-250-65 MG tablet Take 1 tablet by mouth daily as needed for headache.    . clopidogrel (PLAVIX) 75 MG tablet TAKE 1 TABLET BY MOUTH EVERY DAY WITH BREAKFAST 90 tablet 1  . ibuprofen (ADVIL,MOTRIN) 200 MG tablet Take 200 mg by mouth 2 (two) times daily as needed for moderate pain.    . naproxen sodium (ANAPROX) 220 MG tablet Take 220 mg by mouth 2 (two) times daily  as needed (pain).    Marland Kitchen omeprazole (PRILOSEC) 20 MG capsule Take 20 mg by mouth every Monday, Wednesday, and Friday.     . rosuvastatin (CRESTOR) 5 MG tablet Take 5 mg by mouth daily at 6 PM.     . telmisartan (MICARDIS) 80 MG tablet Take by mouth.    Addison Lank Hazel (PREPARATION H EX) Apply 1 application topically daily as needed (hemorrhoid flare).    Marland Kitchen losartan (COZAAR) 50 MG tablet Take 100 mg by mouth daily.     Marland Kitchen oxyCODONE-acetaminophen (PERCOCET/ROXICET) 5-325 MG tablet Take 1 tablet by mouth every 6 (six) hours as needed for moderate pain or severe pain. (Patient not taking: Reported on 12/10/2018) 10 tablet 0   No current facility-administered medications on file prior to visit.    There are no Patient Instructions on file for this visit. No follow-ups on file.   Kris Hartmann, NP

## 2019-07-13 ENCOUNTER — Emergency Department: Payer: Medicare Other

## 2019-07-13 ENCOUNTER — Other Ambulatory Visit: Payer: Self-pay

## 2019-07-13 ENCOUNTER — Inpatient Hospital Stay
Admission: AD | Admit: 2019-07-13 | Discharge: 2019-07-14 | DRG: 552 | Disposition: A | Discharge status: Home Health | Payer: Medicare Other | Attending: Hospitalist | Admitting: Hospitalist

## 2019-07-13 DIAGNOSIS — Z20822 Contact with and (suspected) exposure to covid-19: Secondary | ICD-10-CM | POA: Diagnosis present

## 2019-07-13 DIAGNOSIS — I6529 Occlusion and stenosis of unspecified carotid artery: Secondary | ICD-10-CM | POA: Diagnosis present

## 2019-07-13 DIAGNOSIS — E785 Hyperlipidemia, unspecified: Secondary | ICD-10-CM | POA: Diagnosis present

## 2019-07-13 DIAGNOSIS — Z888 Allergy status to other drugs, medicaments and biological substances status: Secondary | ICD-10-CM

## 2019-07-13 DIAGNOSIS — I6523 Occlusion and stenosis of bilateral carotid arteries: Secondary | ICD-10-CM

## 2019-07-13 DIAGNOSIS — Z79899 Other long term (current) drug therapy: Secondary | ICD-10-CM | POA: Diagnosis not present

## 2019-07-13 DIAGNOSIS — I10 Essential (primary) hypertension: Secondary | ICD-10-CM | POA: Diagnosis present

## 2019-07-13 DIAGNOSIS — Z8673 Personal history of transient ischemic attack (TIA), and cerebral infarction without residual deficits: Secondary | ICD-10-CM

## 2019-07-13 DIAGNOSIS — M5136 Other intervertebral disc degeneration, lumbar region: Secondary | ICD-10-CM | POA: Diagnosis present

## 2019-07-13 DIAGNOSIS — F17290 Nicotine dependence, other tobacco product, uncomplicated: Secondary | ICD-10-CM | POA: Diagnosis present

## 2019-07-13 DIAGNOSIS — Y92019 Unspecified place in single-family (private) house as the place of occurrence of the external cause: Secondary | ICD-10-CM

## 2019-07-13 DIAGNOSIS — F1020 Alcohol dependence, uncomplicated: Secondary | ICD-10-CM | POA: Diagnosis present

## 2019-07-13 DIAGNOSIS — W1839XA Other fall on same level, initial encounter: Secondary | ICD-10-CM | POA: Diagnosis present

## 2019-07-13 DIAGNOSIS — K219 Gastro-esophageal reflux disease without esophagitis: Secondary | ICD-10-CM | POA: Diagnosis present

## 2019-07-13 DIAGNOSIS — R262 Difficulty in walking, not elsewhere classified: Secondary | ICD-10-CM | POA: Diagnosis present

## 2019-07-13 DIAGNOSIS — Z7902 Long term (current) use of antithrombotics/antiplatelets: Secondary | ICD-10-CM | POA: Diagnosis not present

## 2019-07-13 DIAGNOSIS — S22081A Stable burst fracture of T11-T12 vertebra, initial encounter for closed fracture: Principal | ICD-10-CM | POA: Diagnosis present

## 2019-07-13 DIAGNOSIS — S22080A Wedge compression fracture of T11-T12 vertebra, initial encounter for closed fracture: Secondary | ICD-10-CM | POA: Diagnosis present

## 2019-07-13 DIAGNOSIS — Z7982 Long term (current) use of aspirin: Secondary | ICD-10-CM

## 2019-07-13 LAB — COMPREHENSIVE METABOLIC PANEL
ALT: 18 U/L (ref 0–44)
AST: 26 U/L (ref 15–41)
Albumin: 4.1 g/dL (ref 3.5–5.0)
Alkaline Phosphatase: 51 U/L (ref 38–126)
Anion gap: 8 (ref 5–15)
BUN: 16 mg/dL (ref 8–23)
CO2: 29 mmol/L (ref 22–32)
Calcium: 9.3 mg/dL (ref 8.9–10.3)
Chloride: 102 mmol/L (ref 98–111)
Creatinine, Ser: 1.33 mg/dL — ABNORMAL HIGH (ref 0.61–1.24)
GFR calc Af Amer: >60 mL/min (ref >60)
GFR calc non Af Amer: 53 mL/min — ABNORMAL LOW (ref >60)
Glucose, Bld: 130 mg/dL — ABNORMAL HIGH (ref 70–99)
Potassium: 4.5 mmol/L (ref 3.5–5.1)
Sodium: 139 mmol/L (ref 135–145)
Total Bilirubin: 0.8 mg/dL (ref 0.3–1.2)
Total Protein: 7.6 g/dL (ref 6.5–8.1)

## 2019-07-13 LAB — CBC
HCT: 46.9 % (ref 39.0–52.0)
Hemoglobin: 15.4 g/dL (ref 13.0–17.0)
MCH: 31.2 pg (ref 26.0–34.0)
MCHC: 32.8 g/dL (ref 30.0–36.0)
MCV: 95.1 fL (ref 80.0–100.0)
Platelets: 220 10*3/uL (ref 150–400)
RBC: 4.93 MIL/uL (ref 4.22–5.81)
RDW: 12.9 % (ref 11.5–15.5)
WBC: 10.9 10*3/uL — ABNORMAL HIGH (ref 4.0–10.5)
nRBC: 0 % (ref 0.0–0.2)

## 2019-07-13 LAB — CBC WITH DIFFERENTIAL/PLATELET
Abs Immature Granulocytes: 0.05 10*3/uL (ref 0.00–0.07)
Basophils Absolute: 0 10*3/uL (ref 0.0–0.1)
Basophils Relative: 0 %
Eosinophils Absolute: 0.1 10*3/uL (ref 0.0–0.5)
Eosinophils Relative: 1 %
HCT: 44.5 % (ref 39.0–52.0)
Hemoglobin: 15.1 g/dL (ref 13.0–17.0)
Immature Granulocytes: 0 %
Lymphocytes Relative: 5 %
Lymphs Abs: 0.5 10*3/uL — ABNORMAL LOW (ref 0.7–4.0)
MCH: 31.3 pg (ref 26.0–34.0)
MCHC: 33.9 g/dL (ref 30.0–36.0)
MCV: 92.1 fL (ref 80.0–100.0)
Monocytes Absolute: 0.6 10*3/uL (ref 0.1–1.0)
Monocytes Relative: 6 %
Neutro Abs: 9.8 10*3/uL — ABNORMAL HIGH (ref 1.7–7.7)
Neutrophils Relative %: 88 %
Platelets: 194 10*3/uL (ref 150–400)
RBC: 4.83 MIL/uL (ref 4.22–5.81)
RDW: 12.9 % (ref 11.5–15.5)
WBC: 11.1 10*3/uL — ABNORMAL HIGH (ref 4.0–10.5)
nRBC: 0 % (ref 0.0–0.2)

## 2019-07-13 LAB — BASIC METABOLIC PANEL
Anion gap: 9 (ref 5–15)
BUN: 16 mg/dL (ref 8–23)
CO2: 26 mmol/L (ref 22–32)
Calcium: 9.1 mg/dL (ref 8.9–10.3)
Chloride: 102 mmol/L (ref 98–111)
Creatinine, Ser: 1.45 mg/dL — ABNORMAL HIGH (ref 0.61–1.24)
GFR calc Af Amer: 56 mL/min — ABNORMAL LOW (ref >60)
GFR calc non Af Amer: 48 mL/min — ABNORMAL LOW (ref >60)
Glucose, Bld: 163 mg/dL — ABNORMAL HIGH (ref 70–99)
Potassium: 4.2 mmol/L (ref 3.5–5.1)
Sodium: 137 mmol/L (ref 135–145)

## 2019-07-13 LAB — MAGNESIUM: Magnesium: 2.3 mg/dL (ref 1.7–2.4)

## 2019-07-13 LAB — TROPONIN I (HIGH SENSITIVITY): Troponin I (High Sensitivity): 8 ng/L (ref <18)

## 2019-07-13 LAB — SARS CORONAVIRUS 2 BY RT PCR (HOSPITAL ORDER, PERFORMED IN ~~LOC~~ HOSPITAL LAB): SARS Coronavirus 2: NEGATIVE

## 2019-07-13 LAB — PHOSPHORUS: Phosphorus: 3 mg/dL (ref 2.5–4.6)

## 2019-07-13 MED ORDER — PANTOPRAZOLE SODIUM 40 MG PO TBEC
40.0000 mg | DELAYED_RELEASE_TABLET | Freq: Every day | ORAL | Status: DC
  Administered 2019-07-13 – 2019-07-14 (×2): 40 mg via ORAL
  Filled 2019-07-13 (×2): qty 1

## 2019-07-13 MED ORDER — FOLIC ACID 1 MG PO TABS
1.0000 mg | ORAL_TABLET | Freq: Every day | ORAL | Status: DC
  Administered 2019-07-14: 1 mg via ORAL
  Filled 2019-07-13: qty 1

## 2019-07-13 MED ORDER — MORPHINE SULFATE (PF) 2 MG/ML IV SOLN: 2.0000 mg | INTRAVENOUS | Status: DC | PRN

## 2019-07-13 MED ORDER — ONDANSETRON HCL 4 MG/2ML IJ SOLN
INTRAMUSCULAR | Status: AC
  Filled 2019-07-13: qty 2

## 2019-07-13 MED ORDER — ASPIRIN-ACETAMINOPHEN-CAFFEINE 250-250-65 MG PO TABS
1.0000 | ORAL_TABLET | Freq: Every day | ORAL | Status: DC | PRN
  Filled 2019-07-13 (×2): qty 1

## 2019-07-13 MED ORDER — OXYCODONE HCL 5 MG PO TABS: 5.0000 mg | ORAL_TABLET | Freq: Three times a day (TID) | ORAL | 0 refills | Status: DC | PRN

## 2019-07-13 MED ORDER — ONDANSETRON 4 MG PO TBDP: 4.0000 mg | ORAL_TABLET | Freq: Three times a day (TID) | ORAL | 0 refills | Status: DC | PRN

## 2019-07-13 MED ORDER — LORAZEPAM 1 MG PO TABS: 1.0000 mg | ORAL_TABLET | ORAL | Status: DC | PRN

## 2019-07-13 MED ORDER — LORAZEPAM 2 MG PO TABS: 0.0000 mg | ORAL_TABLET | Freq: Four times a day (QID) | ORAL | Status: DC

## 2019-07-13 MED ORDER — THIAMINE HCL 100 MG/ML IJ SOLN: 100.0000 mg | Freq: Every day | INTRAMUSCULAR | Status: DC

## 2019-07-13 MED ORDER — THIAMINE HCL 100 MG PO TABS
100.0000 mg | ORAL_TABLET | Freq: Every day | ORAL | Status: DC
  Administered 2019-07-14: 100 mg via ORAL
  Filled 2019-07-13: qty 1

## 2019-07-13 MED ORDER — ONDANSETRON HCL 4 MG/2ML IJ SOLN
4.0000 mg | Freq: Once | INTRAMUSCULAR | Status: AC
  Administered 2019-07-13: 4 mg via INTRAVENOUS
  Filled 2019-07-13: qty 2

## 2019-07-13 MED ORDER — NAPROXEN 375 MG PO TABS: 375.0000 mg | ORAL_TABLET | Freq: Two times a day (BID) | ORAL | 0 refills | Status: AC

## 2019-07-13 MED ORDER — SODIUM CHLORIDE 0.9% FLUSH
3.0000 mL | Freq: Two times a day (BID) | INTRAVENOUS | Status: DC
  Administered 2019-07-13 – 2019-07-14 (×2): 3 mL via INTRAVENOUS

## 2019-07-13 MED ORDER — IRBESARTAN 75 MG PO TABS: 75.0000 mg | ORAL_TABLET | Freq: Every day | ORAL | Status: DC

## 2019-07-13 MED ORDER — ADULT MULTIVITAMIN W/MINERALS CH
1.0000 | ORAL_TABLET | Freq: Every day | ORAL | Status: DC
  Administered 2019-07-14: 1 via ORAL
  Filled 2019-07-13: qty 1

## 2019-07-13 MED ORDER — ROSUVASTATIN CALCIUM 5 MG PO TABS
5.0000 mg | ORAL_TABLET | Freq: Every day | ORAL | Status: DC
  Administered 2019-07-13: 5 mg via ORAL
  Filled 2019-07-13: qty 1

## 2019-07-13 MED ORDER — SODIUM CHLORIDE 0.9% FLUSH: 3.0000 mL | INTRAVENOUS | Status: DC | PRN

## 2019-07-13 MED ORDER — SODIUM CHLORIDE 0.9 % IV SOLN: 250.0000 mL | INTRAVENOUS | Status: DC | PRN

## 2019-07-13 MED ORDER — LORAZEPAM 2 MG PO TABS: 0.0000 mg | ORAL_TABLET | Freq: Two times a day (BID) | ORAL | Status: DC

## 2019-07-13 MED ORDER — POLYETHYLENE GLYCOL 3350 17 G PO PACK: 17.0000 g | PACK | Freq: Every day | ORAL | 0 refills | Status: DC

## 2019-07-13 MED ORDER — HYDROCODONE-ACETAMINOPHEN 5-325 MG PO TABS
1.0000 | ORAL_TABLET | ORAL | Status: DC | PRN
  Administered 2019-07-13 – 2019-07-14 (×3): 1 via ORAL
  Filled 2019-07-13: qty 1
  Filled 2019-07-13: qty 2
  Filled 2019-07-13: qty 1

## 2019-07-13 MED ORDER — ONDANSETRON HCL 4 MG/2ML IJ SOLN
4.0000 mg | Freq: Once | INTRAMUSCULAR | Status: AC
  Administered 2019-07-13: 4 mg via INTRAVENOUS

## 2019-07-13 MED ORDER — LOSARTAN POTASSIUM 50 MG PO TABS
100.0000 mg | ORAL_TABLET | Freq: Every day | ORAL | Status: DC
  Administered 2019-07-13 – 2019-07-14 (×2): 100 mg via ORAL
  Filled 2019-07-13 (×2): qty 2

## 2019-07-13 MED ORDER — ONDANSETRON HCL 4 MG/2ML IJ SOLN
4.0000 mg | Freq: Four times a day (QID) | INTRAMUSCULAR | Status: DC | PRN
  Administered 2019-07-13: 4 mg via INTRAVENOUS
  Filled 2019-07-13: qty 2

## 2019-07-13 MED ORDER — ONDANSETRON HCL 4 MG PO TABS: 4.0000 mg | ORAL_TABLET | Freq: Four times a day (QID) | ORAL | Status: DC | PRN

## 2019-07-13 MED ORDER — FENTANYL CITRATE (PF) 100 MCG/2ML IJ SOLN
50.0000 ug | Freq: Once | INTRAMUSCULAR | Status: AC
  Administered 2019-07-13: 50 ug via INTRAVENOUS
  Filled 2019-07-13: qty 2

## 2019-07-13 MED ORDER — POLYETHYLENE GLYCOL 3350 17 G PO PACK
17.0000 g | PACK | Freq: Every day | ORAL | Status: DC | PRN
  Administered 2019-07-14: 17 g via ORAL
  Filled 2019-07-13: qty 1

## 2019-07-13 MED ORDER — LORAZEPAM 2 MG/ML IJ SOLN: 1.0000 mg | INTRAMUSCULAR | Status: DC | PRN

## 2019-07-13 NOTE — ED Notes (Signed)
Pt back from MRI 

## 2019-07-13 NOTE — ED Provider Notes (Addendum)
No cord compression on MRI, discussed findings with patient and wife.      Observed patient attempting to stand up on his own, having significant difficulties secondary to pain.  He lives with his wife only and has 5 steps to get into his house.  He will be unable to care for himself at home, I will contact the hospitalist for admission   Joel Drafts, MD 07/13/19 [phone]

## 2019-07-13 NOTE — ED Provider Notes (Signed)
[facility] Emergency Department Provider Note  ____________________________________________  Time seen: Approximately 5:57 AM  I have reviewed the triage vital signs and the nursing notes.   HISTORY  Chief Complaint Back Pain   HPI Joel Ball. is a 69 y.o. male with a history of GERD, hypertension, carotid stenosis on Plavix, hyperlipidemia  who presents for evaluation of back pain.  Patient reports that he sustained a fall 2 days ago.  He reports that he fell onto his buttock.  Has been unable to ambulate.  He was hoping the pain would subside but is still not getting better which prompted visit by EMS this morning.  The pain is in his lower back, dull at rest but if he tries to move it becomes sharp it is constant and nonradiating.  He tried to Percocets at home with no significant relief.  Patient reports that when he fell he had gotten up, felt dizzy, his knees gave out and he fell on the floor.  He has had several episodes of lightheadedness with standing up.  He denies vertigo.  No recent changes on his medications, no headache, no chest pain, no shortness of breath, no unilateral weakness or numbness, no slurred speech, no facial droop.  Past Medical History:  Diagnosis Date  . GERD (gastroesophageal reflux disease)   . Hypertension   . Stroke ([place])   . Tinnitus     Patient Active Problem List   Diagnosis Date Noted  . Hyperlipidemia 10/25/2016  . Hypertension 09/13/2016  . Retinal artery occlusion, branch, right 09/13/2016  . Carotid stenosis 09/13/2016  . GERD without esophagitis 10/31/2013  . Pure hypercholesterolemia 10/31/2013    Past Surgical History:  Procedure Laterality Date  . COLONOSCOPY WITH ESOPHAGOGASTRODUODENOSCOPY (EGD)    . COLONOSCOPY WITH PROPOFOL N/A 05/30/2016   Procedure: COLONOSCOPY WITH PROPOFOL;  Surgeon: Lollie Sails, MD;  Location: [facility] ENDOSCOPY;  Service: Endoscopy;  Laterality: N/A;  . ENDARTERECTOMY  Left 09/27/2016   Procedure: ENDARTERECTOMY CAROTID;  Surgeon: Algernon Huxley, MD;  Location: [facility] ORS;  Service: Vascular;  Laterality: Left;  . ENDARTERECTOMY Right 02/13/2018   Procedure: ENDARTERECTOMY CAROTID;  Surgeon: Algernon Huxley, MD;  Location: [facility] ORS;  Service: Vascular;  Laterality: Right;    Prior to Admission medications   Medication Sig Start Date End Date Taking? Authorizing Provider  aspirin EC 81 MG tablet Take 81 mg by mouth daily.    [provider]  aspirin-acetaminophen-caffeine (EXCEDRIN MIGRAINE) [phone] MG tablet Take 1 tablet by mouth daily as needed for headache.    [provider]  clopidogrel (PLAVIX) 75 MG tablet TAKE 1 TABLET BY MOUTH EVERY DAY WITH BREAKFAST 01/17/19   Algernon Huxley, MD  ibuprofen (ADVIL,MOTRIN) 200 MG tablet Take 200 mg by mouth 2 (two) times daily as needed for moderate pain.    [provider]  losartan (COZAAR) 50 MG tablet Take 100 mg by mouth daily.     [provider]  naproxen sodium (ANAPROX) 220 MG tablet Take 220 mg by mouth 2 (two) times daily as needed (pain).    [provider]  omeprazole (PRILOSEC) 20 MG capsule Take 20 mg by mouth every Monday, Wednesday, and Friday.     [provider]  oxyCODONE-acetaminophen (PERCOCET/ROXICET) 5-325 MG tablet Take 1 tablet by mouth every 6 (six) hours as needed for moderate pain or severe pain. Patient not taking: Reported on 12/10/2018 02/15/18   Stegmayer, Joelene Millin A, PA-C  rosuvastatin ([place])  5 MG tablet Take 5 mg by mouth daily at 6 PM.     [provider]  telmisartan (MICARDIS) 80 MG tablet Take by mouth. 07/15/18 07/15/19  [provider]  Witch Hazel (PREPARATION H EX) Apply 1 application topically daily as needed (hemorrhoid flare).    [provider]    Allergies Statins and Tizanidine  Family History  Problem Relation Age of Onset  . CAD Neg Hx     Social History Social History   Tobacco Use    . Smoking status: Light Tobacco Smoker    Types: Cigars  . Smokeless tobacco: Never Used  . Tobacco comment: three times a year  Vaping Use  . Vaping Use: Never used  Substance Use Topics  . Alcohol use: Yes    Alcohol/week: 15.0 standard drinks    Types: 15 Shots of liquor per week    Comment: 2-3 oz of alcohol per day  . Drug use: No    Review of Systems  Constitutional: Negative for fever. + Lightheadedness Eyes: Negative for visual changes. ENT: Negative for sore throat. Neck: No neck pain  Cardiovascular: Negative for chest pain. Respiratory: Negative for shortness of breath. Gastrointestinal: Negative for abdominal pain, vomiting or diarrhea. Genitourinary: Negative for dysuria. Musculoskeletal: + back pain. Skin: Negative for rash. Neurological: Negative for headaches, weakness or numbness. Psych: No SI or HI  ____________________________________________   PHYSICAL EXAM:  VITAL SIGNS: ED Triage Vitals  Enc Vitals Group     BP 07/13/19 0521 (!) 150/80     Pulse Rate 07/13/19 0521 (!) 107     Resp 07/13/19 0521 20     Temp 07/13/19 0521 98.1 F (36.7 C)     Temp Source 07/13/19 0521 Oral     SpO2 07/13/19 0521 93 %     Weight 07/13/19 0524 185 lb (83.9 kg)     Height 07/13/19 0524 5\' 8"  (1.727 m)     Head Circumference --      Peak Flow --      Pain Score 07/13/19 0522 5     Pain Loc --      Pain Edu? --      Excl. in [place]? --     Constitutional: Alert and oriented. Well appearing and in no apparent distress. HEENT:      Head: Normocephalic and atraumatic.         Eyes: Conjunctivae are normal. Sclera is non-icteric.       Mouth/Throat: Mucous membranes are moist.       Neck: Supple with no signs of meningismus. Cardiovascular: Regular rate and rhythm. No murmurs, gallops, or rubs. 2+ symmetrical distal pulses are present in all extremities. No JVD. Respiratory: Normal respiratory effort. Lungs are clear to auscultation bilaterally. No wheezes,  crackles, or rhonchi.  Gastrointestinal: Soft, non tender, and non distended with positive bowel sounds. No rebound or guarding. Genitourinary: No CVA tenderness. Musculoskeletal: No CT and L-spine tenderness, no paraspinal tenderness, no bruising, full painless range of motion of all extremities. neurologic: Normal speech and language. Face is symmetric.  1+ DTRs in bilateral lower extremities with normal sensation and normal strength Skin: Skin is warm, dry and intact. No rash noted. Psychiatric: Mood and affect are normal. Speech and behavior are normal.  ____________________________________________   LABS (all labs ordered are listed, but only abnormal results are displayed)  Labs Reviewed  CBC WITH DIFFERENTIAL/PLATELET - Abnormal; Notable for the following components:      Result Value  WBC 11.1 (*)    Neutro Abs 9.8 (*)    Lymphs Abs 0.5 (*)    All other components within normal limits  BASIC METABOLIC PANEL  TROPONIN I (HIGH SENSITIVITY)   ____________________________________________  EKG  ED ECG REPORT I, Rudene Re, the attending physician, personally viewed and interpreted this ECG.  Sinus tachycardia, rate of 108, normal intervals, normal axis, no ST elevations or depressions.  Otherwise normal EKG. ____________________________________________  RADIOLOGY  I have personally reviewed the images performed during this visit and I agree with the Radiologist's read.   Interpretation by Radiologist:  CT Lumbar Spine Wo Contrast  Result Date: 07/13/2019 CLINICAL DATA:  Fall EXAM: CT LUMBAR SPINE WITHOUT CONTRAST TECHNIQUE: Multidetector CT imaging of the lumbar spine was performed without intravenous contrast administration. Multiplanar CT image reconstructions were also generated. COMPARISON:  None. FINDINGS: Segmentation: 5 lumbar type vertebrae. Alignment: Normal. Vertebrae: Incomplete burst fracture of T12 with approximately 25% height loss and 4 mm of  retropulsion. Paraspinal and other soft tissues: Negative. Disc levels: No spinal canal stenosis. IMPRESSION: Incomplete burst fracture of T12 with approximately 25% height loss and 4 mm of retropulsion. Electronically Signed   By: Ulyses Jarred M.D.   On: 07/13/2019 05:58     ____________________________________________   PROCEDURES  Procedure(s) performed:yes .1-3 Lead EKG Interpretation Performed by: Rudene Re, MD Authorized by: Rudene Re, MD     Interpretation: normal     ECG rate assessment: normal     Rhythm: sinus rhythm     Ectopy: none     Critical Care performed:  None ____________________________________________   INITIAL IMPRESSION / ASSESSMENT AND PLAN / ED COURSE   69 y.o. male with a history of GERD, hypertension, carotid stenosis on Plavix, hyperlipidemia  who presents for evaluation of traumatic back pain. Patient is neurologically intact with 1+ DTRs and intact strength and sensation. Pain is not really reproducible on palpation of his back which looks atraumatic with no bruising. A CT of the lumbar spine was done showing a T12 burst fracture with a 4 mm of retropulsion. We will get an MRI to rule out any spinal cord compression. Patient reports these episodes of feeling lightheaded. Unfortunately at this time we can do orthostatic vital signs but I will check some labs just to make sure patient does not have dehydration, anemia, AKI, or electrolyte derangements. In the meantime we will monitor him on telemetry to make sure there is no signs of dysrhythmias. Patient was given a dose of IV fentanyl for pain. History is gathered from patient and his wife is at bedside. Care discussed with both of them as well. Old medical records reviewed.  ED COURSE: MRI pending.  Care transferred to Dr. Ermalinda Barrios.    _____________________________________________ Please note:  Patient was evaluated in Emergency Department today for the symptoms described in the history  of present illness. Patient was evaluated in the context of the global COVID-19 pandemic, which necessitated consideration that the patient might be at risk for infection with the SARS-CoV-2 virus that causes COVID-19. Institutional protocols and algorithms that pertain to the evaluation of patients at risk for COVID-19 are in a state of rapid change based on information released by regulatory bodies including the CDC and federal and state organizations. These policies and algorithms were followed during the patient's care in the ED.  Some ED evaluations and interventions may be delayed as a result of limited staffing during the pandemic.   [place] Controlled Substance Database was reviewed by  me. ____________________________________________   FINAL CLINICAL IMPRESSION(S) / ED DIAGNOSES   Final diagnoses:  Closed burst fracture of T12 vertebra ([place])      NEW MEDICATIONS STARTED DURING THIS VISIT:  ED Discharge Orders    None       Note:  This document was prepared using Dragon voice recognition software and may include unintentional dictation errors.    Alfred Levins, [place], MD 07/14/19 [phone]

## 2019-07-13 NOTE — Plan of Care (Signed)

## 2019-07-13 NOTE — ED Notes (Signed)
Pt placed on 2LPM via  due to low O2 sats following fentanyl admin at 87% RA

## 2019-07-13 NOTE — ED Notes (Signed)
Attempted report. Secretary reported charge nurse has not assigned bed yet or RN to take patient and will call this RN back

## 2019-07-13 NOTE — Discharge Instructions (Signed)
Please take the naproxen twice a day with breakfast and dinner Take 650 mg of Tylenol every 8 hours Take the oxycodone as needed for moderate to severe pain Take MiraLAX daily, feel free to supplement with stool softeners and/or magnesium citrate if difficulty with stooling Please follow-up Dr. Rudene Christians of orthopedics

## 2019-07-13 NOTE — ED Notes (Signed)
Attempted report. Charge RN reports she has not assigned bed yet or RN

## 2019-07-13 NOTE — Consult Note (Signed)
Reason for Consult: T12 compression fracture Referring Physician: Dr. Forest Gleason Early Steel. is an 69 y.o. male.  HPI: Patient suffered a fall on Friday directly onto his buttock and subsequently had severe pain starting yesterday and subsequently came to the emergency room with inability to ambulate secondary to severe back pain.  He has a history of prior carotid surgery and has been on Plavix but did not take his Saturday dose.  Denies prodromal symptoms or chronic back problems.  Past Medical History:  Diagnosis Date  . GERD (gastroesophageal reflux disease)   . Hypertension   . Stroke ([place])   . Tinnitus     Past Surgical History:  Procedure Laterality Date  . COLONOSCOPY WITH ESOPHAGOGASTRODUODENOSCOPY (EGD)    . COLONOSCOPY WITH PROPOFOL N/A 05/30/2016   Procedure: COLONOSCOPY WITH PROPOFOL;  Surgeon: Lollie Sails, MD;  Location: [facility] ENDOSCOPY;  Service: Endoscopy;  Laterality: N/A;  . ENDARTERECTOMY Left 09/27/2016   Procedure: ENDARTERECTOMY CAROTID;  Surgeon: Algernon Huxley, MD;  Location: [facility] ORS;  Service: Vascular;  Laterality: Left;  . ENDARTERECTOMY Right 02/13/2018   Procedure: ENDARTERECTOMY CAROTID;  Surgeon: Algernon Huxley, MD;  Location: [facility] ORS;  Service: Vascular;  Laterality: Right;    Family History  Problem Relation Age of Onset  . CAD Neg Hx     Social History:  reports that he has been smoking cigars. He has never used smokeless tobacco. He reports current alcohol use of about 15.0 standard drinks of alcohol per week. He reports that he does not use drugs.  Allergies:  Allergies  Allergen Reactions  . Statins     In high doses causes muscle pain  . Tizanidine Other (See Comments)    Medications: I have reviewed the patient's current medications.  Results for orders placed or performed during the hospital encounter of 07/13/19 (from the past 48 hour(s))  CBC with Differential/Platelet     Status: Abnormal   Collection Time: 07/13/19  5:45 AM   Result Value Ref Range   WBC 11.1 (H) 4.0 - 10.5 K/uL   RBC 4.83 4.22 - 5.81 MIL/uL   Hemoglobin 15.1 13.0 - 17.0 g/dL   HCT 44.5 39 - 52 %   MCV 92.1 80.0 - 100.0 fL   MCH 31.3 26.0 - 34.0 pg   MCHC 33.9 30.0 - 36.0 g/dL   RDW 12.9 11.5 - 15.5 %   Platelets 194 150 - 400 K/uL   nRBC 0.0 0.0 - 0.2 %   Neutrophils Relative % 88 %   Neutro Abs 9.8 (H) 1.7 - 7.7 K/uL   Lymphocytes Relative 5 %   Lymphs Abs 0.5 (L) 0.7 - 4.0 K/uL   Monocytes Relative 6 %   Monocytes Absolute 0.6 0 - 1 K/uL   Eosinophils Relative 1 %   Eosinophils Absolute 0.1 0 - 0 K/uL   Basophils Relative 0 %   Basophils Absolute 0.0 0 - 0 K/uL   Immature Granulocytes 0 %   Abs Immature Granulocytes 0.05 0.00 - 0.07 K/uL    Comment: Performed at [facility], [address] [place], [place] [address]  Basic metabolic panel     Status: Abnormal   Collection Time: 07/13/19  5:45 AM  Result Value Ref Range   Sodium 137 135 - 145 mmol/L   Potassium 4.2 3.5 - 5.1 mmol/L   Chloride 102 98 - 111 mmol/L   CO2 26 22 - 32 mmol/L   Glucose, Bld 163 (H) 70 -  99 mg/dL    Comment: Glucose reference range applies only to samples taken after fasting for at least 8 hours.   BUN 16 8 - 23 mg/dL   Creatinine, Ser 1.45 (H) 0.61 - 1.24 mg/dL   Calcium 9.1 8.9 - 10.3 mg/dL   GFR calc non Af Amer 48 (L) >60 mL/min   GFR calc Af Amer 56 (L) >60 mL/min   Anion gap 9 5 - 15    Comment: Performed at [facility], [place], [place] [address]  Troponin I (High Sensitivity)     Status: None   Collection Time: 07/13/19  5:45 AM  Result Value Ref Range   Troponin I (High Sensitivity) 8 <18 ng/L    Comment: (NOTE) Elevated high sensitivity troponin I (hsTnI) values and significant  changes across serial measurements may suggest ACS but many other  chronic and acute conditions are known to elevate hsTnI results.  Refer to the "Links" section for chest pain algorithms and additional   guidance. Performed at [facility], [address] [place], [place] [address]   SARS Coronavirus 2 by RT PCR (hospital order, performed in [facility] hospital lab) Nasopharyngeal Nasopharyngeal Swab     Status: None   Collection Time: 07/13/19 10:09 AM   Specimen: Nasopharyngeal Swab  Result Value Ref Range   SARS Coronavirus 2 NEGATIVE NEGATIVE    Comment: (NOTE) SARS-CoV-2 target nucleic acids are NOT DETECTED.  The SARS-CoV-2 RNA is generally detectable in upper and lower respiratory specimens during the acute phase of infection. The lowest concentration of SARS-CoV-2 viral copies this assay can detect is 250 copies / mL. A negative result does not preclude SARS-CoV-2 infection and should not be used as the sole basis for treatment or other patient management decisions.  A negative result may occur with improper specimen collection / handling, submission of specimen other than nasopharyngeal swab, presence of viral mutation(s) within the areas targeted by this assay, and inadequate number of viral copies (<250 copies / mL). A negative result must be combined with clinical observations, patient history, and epidemiological information.  Fact Sheet for Patients:   StrictlyIdeas.no  Fact Sheet for Healthcare Providers: BankingDealers.co.za  This test is not yet approved or  cleared by the [place] FDA and has been authorized for detection and/or diagnosis of SARS-CoV-2 by FDA under an Emergency Use Authorization (EUA).  This EUA will remain in effect (meaning this test can be used) for the duration of the COVID-19 declaration under Section 564(b)(1) of the Act, 21 U.S.C. section 360bbb-3(b)(1), unless the authorization is terminated or revoked sooner.  Performed at [facility], [place], [place] [address]     CT Lumbar Spine Wo Contrast  Result Date: 07/13/2019 CLINICAL DATA:  Fall  EXAM: CT LUMBAR SPINE WITHOUT CONTRAST TECHNIQUE: Multidetector CT imaging of the lumbar spine was performed without intravenous contrast administration. Multiplanar CT image reconstructions were also generated. COMPARISON:  None. FINDINGS: Segmentation: 5 lumbar type vertebrae. Alignment: Normal. Vertebrae: Incomplete burst fracture of T12 with approximately 25% height loss and 4 mm of retropulsion. Paraspinal and other soft tissues: Negative. Disc levels: No spinal canal stenosis. IMPRESSION: Incomplete burst fracture of T12 with approximately 25% height loss and 4 mm of retropulsion. Electronically Signed   By: Ulyses Jarred M.D.   On: 07/13/2019 05:58   MR LUMBAR SPINE WO CONTRAST  Result Date: 07/13/2019 CLINICAL DATA:  Low back pain with cauda equina suspected EXAM: MRI LUMBAR SPINE  WITHOUT CONTRAST TECHNIQUE: Multiplanar, multisequence MR imaging of the lumbar spine was performed. No intravenous contrast was administered. COMPARISON:  Lumbar spine CT from earlier today FINDINGS: Segmentation:  5 lumbar type vertebrae Alignment:  Physiologic Vertebrae: Horizontal fracture plane with marrow edema through the T12 body with mild height loss and posterior cortex bulging. Height loss measures 30% when compared to L1. No evidence of bone lesion Conus medullaris and cauda equina: Conus extends to the L1 level. Conus and cauda equina appear normal. Paraspinal and other soft tissues: No significant finding Disc levels: T12- L1: Unremarkable. L1-L2: Mild disc narrowing and bulging. There is a tiny left paracentral protrusion and annular fissure L2-L3: Mild disc bulging with annular fissuring. L3-L4: Disc narrowing and bulging with a right paracentral protrusion partially effaces the subarticular recess. The foramina are patent L4-L5: Mild annulus bulging and borderline facet spurring L5-S1:Unremarkable. IMPRESSION: 1. Acute T12 body fracture with 30% height loss and mild posterior cortex displacement not causing  cord compression. 2. Disc degeneration from L1-2 to L4-5, most notable at L3-4 where there is a right paracentral protrusion effacing the thecal sac at the subarticular recess. Electronically Signed   By: Monte Fantasia M.D.   On: 07/13/2019 07:17    Review of Systems Blood pressure 124/72, pulse 88, temperature 97.9 F (36.6 C), temperature source Oral, resp. rate 16, height 5\' 8"  (1.727 m), weight 83.9 kg, SpO2 99 %. Physical Exam he is tender to percussion at T12 with a slight kyphotic deformity..  He has no clonus or neurovascular loss slight radiation down lower in the back. Radiology review CT and MRI shows acute T12 fracture 30% height loss with extensive edema.  Assessment/Plan: T12 compression fracture with inability to ambulate secondary to severe pain Plan is to have them try to get up with physical therapy and if he is unable to manage that we could do kyphoplasty on Tuesday, that would be far enough out from his prior Plavix dose.  Hessie Knows 07/13/2019, 12:43 PM

## 2019-07-13 NOTE — ED Notes (Signed)
Pt to MRI

## 2019-07-13 NOTE — ED Triage Notes (Signed)
Pt comes to ED via [facility] due to back pain. Pt states he became light headed Friday and fell backwards landing on back. Pt does report alcohol use prior to fall. Since fall pt has experienced lower back pain that is worse with sitting up. With EMS pt did become diaphoretic and is still on arrival.

## 2019-07-13 NOTE — H&P (Signed)
History and Physical    [name]. IRW:[id number] DOB: [birth year] DOA: 07/13/2019  PCP: Joel Late, MD   Patient coming from: Home I have personally briefly reviewed patient's old medical records in [place]  Chief Complaint: Back pain  HPI: Joel Ball. is a 69 y.o. male with medical history significant for GERD, hypertension, carotid artery stenosis on Plavix and dyslipidemia who presents to the emergency room for evaluation of back pain.  Patient reports that he sustained a fall 2 days prior to presenting to the emergency room, he fell backwards landing on his buttocks and since then has been unable to ambulate.  He rated his back pain a 10 x 10 in intensity at its worst, describes it as a sharp pain mostly in his lower back, worse with movement and nonradiating.  He had taken Percocet at home without any relief of his pain.  He was unable to get out of bed since his fall and has not been able to get up to use the bathroom.  Attempts were made to ambulate the patient in the emergency room and was unsuccessful. CT scan of the lumbar spine without contrast showed incomplete burst fracture of T12 with approximately 25% height loss and 4 mm of retropulsion Patient had a lumbar spine MRI which showed  acute T12 body fracture with 30% height loss and mild posterior cortex displacement not causing cord compression.Disc degeneration from L1-2 to L4-5, most notable at L3-4 where there is a right paracentral protrusion effacing the thecal sac at the subarticular recess. Patient had episodes of dizziness and lightheadedness prior to his fall He denies having any urinary or fecal incontinence, lower extremity weakness, chest pain, shortness of breath, nausea, vomiting, dizziness, fever, chills, cough, abdominal pain or changes in his bowel habits.   ED Course: 69 year old male who presents to the emergency room after a fall at home with complaints of severe back pain with  difficulty ambulating.  Imaging shows a T12 compression fracture.  Patient will be admitted to the hospital for further evaluation.   Review of Systems: As per HPI otherwise 10 point review of systems negative.    Past Medical History:  Diagnosis Date   GERD (gastroesophageal reflux disease)    Hypertension    Stroke [facility])    Tinnitus     Past Surgical History:  Procedure Laterality Date   COLONOSCOPY WITH ESOPHAGOGASTRODUODENOSCOPY (EGD)     COLONOSCOPY WITH PROPOFOL N/A 05/30/2016   Procedure: COLONOSCOPY WITH PROPOFOL;  Surgeon: Joel Sails, MD;  Location[facility] ENDOSCOPY;  Service: Endoscopy;  Laterality: N/A;   ENDARTERECTOMY Left 09/27/2016   Procedure: ENDARTERECTOMY CAROTID;  Surgeon: Joel Huxley, MD;  Location: [facility] ORS;  Service: Vascular;  Laterality: Left;   ENDARTERECTOMY Right 02/13/2018   Procedure: ENDARTERECTOMY CAROTID;  Surgeon: Joel Huxley, MD;  Location: [facility] ORS;  Service: Vascular;  Laterality: Right;     reports that he has been smoking cigars. He has never used smokeless tobacco. He reports current alcohol use of about 15.0 standard drinks of alcohol per week. He reports that he does not use drugs.  Allergies  Allergen Reactions   Statins     In high doses causes muscle pain   Tizanidine Other (See Comments)    Family History  Problem Relation Age of Onset   CAD Neg Hx      Prior to Admission medications   Medication Sig Start Date End Date Taking? Authorizing Provider  aspirin EC  81 MG tablet Take 81 mg by mouth daily.    [provider]  aspirin-acetaminophen-caffeine (EXCEDRIN MIGRAINE) [phone] MG tablet Take 1 tablet by mouth daily as needed for headache.    [provider]  clopidogrel (PLAVIX) 75 MG tablet TAKE 1 TABLET BY MOUTH EVERY DAY WITH BREAKFAST 01/17/19   Joel Huxley, MD  ibuprofen (ADVIL,MOTRIN) 200 MG tablet Take 200 mg by mouth 2 (two) times daily as needed for moderate pain.    [provider]  losartan (COZAAR) 50 MG tablet Take 100 mg by mouth daily.     [provider]  naproxen (NAPROSYN) 375 MG tablet Take 1 tablet (375 mg total) by mouth 2 (two) times daily with a meal for 10 days. 07/13/19 07/23/19  Joel Drafts, MD  naproxen sodium (ANAPROX) 220 MG tablet Take 220 mg by mouth 2 (two) times daily as needed (pain).    [provider]  omeprazole (PRILOSEC) 20 MG capsule Take 20 mg by mouth every Monday, Wednesday, and Friday.     [provider]  ondansetron (ZOFRAN ODT) 4 MG disintegrating tablet Take 1 tablet (4 mg total) by mouth every 8 (eight) hours as needed. 07/13/19   Joel Drafts, MD  oxyCODONE (ROXICODONE) 5 MG immediate release tablet Take 1 tablet (5 mg total) by mouth every 8 (eight) hours as needed. 07/13/19 07/12/20  Joel Drafts, MD  oxyCODONE-acetaminophen (PERCOCET/ROXICET) 5-325 MG tablet Take 1 tablet by mouth every 6 (six) hours as needed for moderate pain or severe pain. Patient not taking: Reported on 12/10/2018 02/15/18   Stegmayer, Joelene Millin A, PA-C  polyethylene glycol (MIRALAX) 17 g packet Take 17 g by mouth daily. 07/13/19   Joel Drafts, MD  rosuvastatin (CRESTOR) 5 MG tablet Take 5 mg by mouth daily at 6 PM.     [provider]  telmisartan (MICARDIS) 80 MG tablet Take by mouth. 07/15/18 07/15/19  [provider]  Witch Hazel (PREPARATION H EX) Apply 1 application topically daily as needed (hemorrhoid flare).    [provider]    Physical Exam: Vitals:   07/13/19 0730 07/13/19 0800 07/13/19 0930 07/13/19 1034  BP: 128/74 128/72 128/67 124/72  Pulse: 88 90 81 88  Resp: (!) 25 18 16 16   Temp:    97.9 F (36.6 C)  TempSrc:    Oral  SpO2: 97% 94% 95% 99%  Weight:      Height:         Vitals:   07/13/19 0730 07/13/19 0800 07/13/19 0930 07/13/19 1034  BP: 128/74 128/72 128/67 124/72  Pulse: 88 90 81 88  Resp: (!) 25 18 16 16   Temp:    97.9 F (36.6 C)  TempSrc:    Oral    SpO2: 97% 94% 95% 99%  Weight:      Height:        Constitutional: NAD, alert and oriented x 3.  Appears comfortable and in no distress Eyes: PERRL, lids and conjunctivae normal ENMT: Mucous membranes are moist.  Neck: normal, supple, no masses, no thyromegaly Respiratory: clear to auscultation bilaterally, no wheezing, no crackles. Normal respiratory effort. No accessory muscle use.  Cardiovascular: Regular rate and rhythm, no murmurs / rubs / gallops. No extremity edema. 2+ pedal pulses. No carotid bruits.  Abdomen: no tenderness, no masses palpated. No hepatosplenomegaly. Bowel sounds positive.  Musculoskeletal: no clubbing / cyanosis. No joint deformity upper and lower extremities.  Skin: no rashes, lesions, ulcers.  Neurologic: No gross focal neurologic  deficit.  Able to move all extremities Psychiatric: Normal mood and affect.   Labs on Admission: I have personally reviewed following labs and imaging studies  CBC: Recent Labs  Lab 07/13/19 0545  WBC 11.1*  NEUTROABS 9.8*  HGB 15.1  HCT 44.5  MCV 92.1  PLT 671   Basic Metabolic Panel: Recent Labs  Lab 07/13/19 0545  NA 137  K 4.2  CL 102  CO2 26  GLUCOSE 163*  BUN 16  CREATININE 1.45*  CALCIUM 9.1   GFR: Estimated Creatinine Clearance: 49.3 mL/min (A) (by C-G formula based on SCr of 1.45 mg/dL (H)). Liver Function Tests: No results for input(s): AST, ALT, ALKPHOS, BILITOT, PROT, ALBUMIN in the last 168 hours. No results for input(s): LIPASE, AMYLASE in the last 168 hours. No results for input(s): AMMONIA in the last 168 hours. Coagulation Profile: No results for input(s): INR, PROTIME in the last 168 hours. Cardiac Enzymes: No results for input(s): CKTOTAL, CKMB, CKMBINDEX, TROPONINI in the last 168 hours. BNP (last 3 results) No results for input(s): PROBNP in the last 8760 hours. HbA1C: No results for input(s): HGBA1C in the last 72 hours. CBG: No results for input(s): GLUCAP in the last 168  hours. Lipid Profile: No results for input(s): CHOL, HDL, LDLCALC, TRIG, CHOLHDL, LDLDIRECT in the last 72 hours. Thyroid Function Tests: No results for input(s): TSH, T4TOTAL, FREET4, T3FREE, THYROIDAB in the last 72 hours. Anemia Panel: No results for input(s): VITAMINB12, FOLATE, FERRITIN, TIBC, IRON, RETICCTPCT in the last 72 hours. Urine analysis: No results found for: COLORURINE, APPEARANCEUR, LABSPEC, PHURINE, GLUCOSEU, HGBUR, BILIRUBINUR, KETONESUR, PROTEINUR, UROBILINOGEN, NITRITE, LEUKOCYTESUR  Radiological Exams on Admission: CT Lumbar Spine Wo Contrast  Result Date: 07/13/2019 CLINICAL DATA:  Fall EXAM: CT LUMBAR SPINE WITHOUT CONTRAST TECHNIQUE: Multidetector CT imaging of the lumbar spine was performed without intravenous contrast administration. Multiplanar CT image reconstructions were also generated. COMPARISON:  None. FINDINGS: Segmentation: 5 lumbar type vertebrae. Alignment: Normal. Vertebrae: Incomplete burst fracture of T12 with approximately 25% height loss and 4 mm of retropulsion. Paraspinal and other soft tissues: Negative. Disc levels: No spinal canal stenosis. IMPRESSION: Incomplete burst fracture of T12 with approximately 25% height loss and 4 mm of retropulsion. Electronically Signed   By: Ulyses Jarred M.D.   On: 07/13/2019 05:58   MR LUMBAR SPINE WO CONTRAST  Result Date: 07/13/2019 CLINICAL DATA:  Low back pain with cauda equina suspected EXAM: MRI LUMBAR SPINE WITHOUT CONTRAST TECHNIQUE: Multiplanar, multisequence MR imaging of the lumbar spine was performed. No intravenous contrast was administered. COMPARISON:  Lumbar spine CT from earlier today FINDINGS: Segmentation:  5 lumbar type vertebrae Alignment:  Physiologic Vertebrae: Horizontal fracture plane with marrow edema through the T12 body with mild height loss and posterior cortex bulging. Height loss measures 30% when compared to L1. No evidence of bone lesion Conus medullaris and cauda equina: Conus extends  to the L1 level. Conus and cauda equina appear normal. Paraspinal and other soft tissues: No significant finding Disc levels: T12- L1: Unremarkable. L1-L2: Mild disc narrowing and bulging. There is a tiny left paracentral protrusion and annular fissure L2-L3: Mild disc bulging with annular fissuring. L3-L4: Disc narrowing and bulging with a right paracentral protrusion partially effaces the subarticular recess. The foramina are patent L4-L5: Mild annulus bulging and borderline facet spurring L5-S1:Unremarkable. IMPRESSION: 1. Acute T12 body fracture with 30% height loss and mild posterior cortex displacement not causing cord compression. 2. Disc degeneration from L1-2 to L4-5, most notable at L3-4 where there  is a right paracentral protrusion effacing the thecal sac at the subarticular recess. Electronically Signed   By: Monte Fantasia M.D.   On: 07/13/2019 07:17    EKG: Independently reviewed.  Sinus tachycardia  Assessment/Plan Principal Problem:   Burst fracture of T12 vertebra (HCC) Active Problems:   Hypertension   Carotid stenosis   GERD (gastroesophageal reflux disease)   Alcohol dependence (HCC)    T12 Burst fracture Acute Following a fall Pain control We will consult orthopedics for possible kyphoplasty Fall precautions   Hypertension Continue Cozaar   History of carotid stenosis Continue statins Hold aspirin and Plavix for now in anticipation of possible procedure   GERD Continue PPI   History of alcohol dependence Patient has 3 drinks daily but denies having any symptoms of alcohol withdrawal when he does not drink We will monitor closely during this hospitalization for symptoms of alcohol withdrawal Administer lorazepam for CIWA score of 8 or greater   DVT prophylaxis: SCD Code Status: Full code Family Communication:  Disposition Plan: Back to previous home environment Consults called: Orthopedics    Ronn Smolinsky MD [facility]     07/13/2019, 12:22 PM

## 2019-07-13 NOTE — ED Notes (Signed)
Pt to CT at this time.

## 2019-07-14 LAB — CBC
HCT: 43.6 % (ref 39.0–52.0)
Hemoglobin: 15 g/dL (ref 13.0–17.0)
MCH: 31.6 pg (ref 26.0–34.0)
MCHC: 34.4 g/dL (ref 30.0–36.0)
MCV: 92 fL (ref 80.0–100.0)
Platelets: 182 10*3/uL (ref 150–400)
RBC: 4.74 MIL/uL (ref 4.22–5.81)
RDW: 12.8 % (ref 11.5–15.5)
WBC: 10 10*3/uL (ref 4.0–10.5)
nRBC: 0 % (ref 0.0–0.2)

## 2019-07-14 LAB — BASIC METABOLIC PANEL
Anion gap: 10 (ref 5–15)
BUN: 15 mg/dL (ref 8–23)
CO2: 27 mmol/L (ref 22–32)
Calcium: 9 mg/dL (ref 8.9–10.3)
Chloride: 101 mmol/L (ref 98–111)
Creatinine, Ser: 1.23 mg/dL (ref 0.61–1.24)
GFR calc Af Amer: >60 mL/min (ref >60)
GFR calc non Af Amer: 59 mL/min — ABNORMAL LOW (ref >60)
Glucose, Bld: 105 mg/dL — ABNORMAL HIGH (ref 70–99)
Potassium: 4.2 mmol/L (ref 3.5–5.1)
Sodium: 138 mmol/L (ref 135–145)

## 2019-07-14 MED ORDER — POLYETHYLENE GLYCOL 3350 17 G PO PACK: 17.0000 g | PACK | Freq: Two times a day (BID) | ORAL | 0 refills | Status: DC

## 2019-07-14 MED ORDER — OXYCODONE HCL 5 MG PO TABS: 5.0000 mg | ORAL_TABLET | Freq: Four times a day (QID) | ORAL | 0 refills | Status: AC | PRN

## 2019-07-14 NOTE — Evaluation (Signed)
Occupational Therapy Evaluation Patient Details Name: Joel Ball. MRN: [id number] DOB: 16-Apr-1950 Today's Date: 07/14/2019    History of Present Illness Pt is a 69 y.o. male presenting to hospital 6/13 d/t fall 2 days prior onto buttock--pt unable to ambulate d/t low back pain.  Pt also with several episodes of lightheadedness with standing up.  Imaging showing incomplete T12 burst fx with 4 mm of retropulsion (no cord compression).  PMH includes htn, carotid stenosis, HLD, stroke, tinnitus, and h/o B endarterectomies.   Clinical Impression   Joel Ball presents with his wife this afternoon, in good humor and agreeable to evaluation. Prior to hospitalization, pt was independent in ADL/IADL and functional mobility without AD. He lives with his wife, who will be available for 24/7 supervision upon discharge. During session, pt and caregiver educated re: back precautions in the context of ADL, safe DME/AE use to maximize independence, car transfer techniques and home/routine modifications to maximize safety. Pt and his wife were receptive and grateful; handout provided for reinforcement and carryover. He completed sit to stand from recliner initially without AD. RW provided for increased stability and support; verbal cues for optimal hand positioning. Pt will benefit from skilled acute OT services while in house to address impairments (see OT Problem List) limiting ADL participation. No OT services anticipated upon discharge.      Follow Up Recommendations  No OT follow up    Equipment Recommendations  3 in 1 bedside commode (BSC delivered during eval)    Recommendations for Other Services       Precautions / Restrictions Precautions Precautions: Fall;Back Precaution Comments: spinal precautions (T12 burst fx); per secure messaging with MD Rudene Christians 6/14--no brace use at this time Restrictions Weight Bearing Restrictions: No      Mobility Bed Mobility Overal bed mobility: Needs  Assistance Bed Mobility: Rolling;Sidelying to Sit Rolling: Min guard Sidelying to sit: Min guard       General bed mobility comments: Unobserved, pt in chair at start and finish  Transfers Overall transfer level: Needs assistance Equipment used: Rolling walker (2 wheeled) Transfers: Sit to/from Stand Sit to Stand: Supervision         General transfer comment: Pt initially stood from recliner without RW, immediately provided with RW to improve safety. Supervision and verbal cues for hand placement provided.    Balance Overall balance assessment: Needs assistance Sitting-balance support: No upper extremity supported;Feet supported Sitting balance-Leahy Scale: Good Sitting balance - Comments: steady sitting reaching within BOS   Standing balance support: No upper extremity supported Standing balance-Leahy Scale: Fair Standing balance comment: steady static standing, improved balance using RW for BUE support                           ADL either performed or assessed with clinical judgement   ADL Overall ADL's : Needs assistance/impaired                                     Functional mobility during ADLs: Supervision/safety;Rolling walker General ADL Comments: Per clinical obs, pt requires Supervision-Setup A for all UB/LB ADL and verbal cues for maintaining back precautions. Recommend use of RW for increased balance and support and other DME/AE for increased independence and safety (i.e. shower bench, HH shower head, reacher)     Vision Baseline Vision/History: Wears glasses Wears Glasses: At all times  Perception     Praxis      Pertinent Vitals/Pain Pain Assessment: 0-10 Pain Score: 3  Pain Location: low back Pain Descriptors / Indicators: Discomfort;Grimacing Pain Intervention(s): Limited activity within patient's tolerance;Monitored during session;Repositioned     Hand Dominance     Extremity/Trunk Assessment Upper Extremity  Assessment Upper Extremity Assessment: Overall WFL for tasks assessed   Lower Extremity Assessment Lower Extremity Assessment: Overall WFL for tasks assessed   Cervical / Trunk Assessment Cervical / Trunk Assessment: Normal   Communication Communication Communication: No difficulties   Cognition Arousal/Alertness: Awake/alert Behavior During Therapy: WFL for tasks assessed/performed Overall Cognitive Status: Within Functional Limits for tasks assessed                                     General Comments       Exercises Other Exercises Other Exercises: Pt and wife educated re: back precautions in the context of ADL, safe DME/AE use to maximize independence, car transfer techniques and home/routine modifications for safety. Handout provided for reinforcement and carryover.   Shoulder Instructions      Home Living Family/patient expects to be discharged to:: Private residence Living Arrangements: Spouse/significant other Available Help at Discharge: Family;Available 24 hours/day Type of Home: House Home Access: Stairs to enter [employer] of Steps: 4 Entrance Stairs-Rails: Left Home Layout: Two level;Able to live on main level with bedroom/bathroom     Bathroom Shower/Tub: Occupational psychologist: Handicapped height     Home Equipment: Shower seat - built in   Additional Comments: Has borrowed RW      Prior Functioning/Environment Level of Independence: Independent        Comments: No falls prior to recent fall in past 6 months        OT Problem List: Decreased strength;Decreased range of motion;Decreased activity tolerance;Impaired balance (sitting and/or standing);Decreased knowledge of precautions;Decreased knowledge of use of DME or AE;Decreased safety awareness;Pain      OT Treatment/Interventions: Self-care/ADL training;Therapeutic exercise;Therapeutic activities;DME and/or AE instruction;Patient/family  education;Balance training    OT Goals(Current goals can be found in the care plan section) Acute Rehab OT Goals Patient Stated Goal: to improve pain and mobility OT Goal Formulation: With patient/family Time For Goal Achievement: 07/28/19 Potential to Achieve Goals: Good  OT Frequency: Min 2X/week   Barriers to D/C:            Co-evaluation              AM-PAC OT "6 Clicks" Daily Activity     Outcome Measure Help from another person eating meals?: None Help from another person taking care of personal grooming?: None Help from another person toileting, which includes using toliet, bedpan, or urinal?: A Little Help from another person bathing (including washing, rinsing, drying)?: A Little Help from another person to put on and taking off regular upper body clothing?: A Little Help from another person to put on and taking off regular lower body clothing?: A Little 6 Click Score: 20   End of Session Equipment Utilized During Treatment: Gait belt;Rolling walker Nurse Communication: Other (comment) (discharge planning)  Activity Tolerance: Patient tolerated treatment well Patient left: in bed;with call bell/phone within reach;with chair alarm set;with family/visitor present  OT Visit Diagnosis: Other abnormalities of gait and mobility (R26.89);History of falling (Z91.81);Pain Pain - part of body:  (back)  Time: 1319-1350 OT Time Calculation (min): 31 min Charges:  OT General Charges $OT Visit: 1 Visit OT Evaluation $OT Eval Moderate Complexity: 1 Mod OT Treatments $Self Care/Home Management : 23-37 mins  Jerilynn Birkenhead, OTS 07/14/19, 2:16 PM

## 2019-07-14 NOTE — Evaluation (Signed)
Physical Therapy Evaluation Patient Details Name: Joel Ball. MRN: [id number] DOB: April 26, 1950 Today's Date: 07/14/2019   History of Present Illness  Pt is a 69 y.o. male presenting to hospital 6/13 d/t fall 2 days prior onto buttock--pt unable to ambulate d/t low back pain.  Pt also with several episodes of lightheadedness with standing up.  Imaging showing incomplete T12 burst fx with 4 mm of retropulsion (no cord compression).  PMH includes htn, carotid stenosis, HLD, stroke, tinnitus, and h/o B endarterectomies.  Clinical Impression  Prior to hospital admission, pt was independent; lives with his wife on main level of home (4 STE with L railing).  Currently pt is CGA with logrolling semi-supine to sitting edge of bed; CGA with transfers; and CGA with ambulation 130 feet with RW.  Pain low back 0/10 at rest beginning of session and 1/10 discomfort end of session (pt was pre-medicated with pain medication for optimal timing of therapy session participation).  Reviewed spinal precautions with pt and pt's wife.  Orthostatics taken during session per nursing request (see vitals section in EMR).  Pt would benefit from skilled PT to address noted impairments and functional limitations (see below for any additional details).  Upon hospital discharge, pt would benefit from HHPT and SBA with functional mobility for safety.    Follow Up Recommendations Home health PT;Supervision for mobility/OOB    Equipment Recommendations  Rolling walker with 5" wheels;3in1 (PT)    Recommendations for Other Services OT consult     Precautions / Restrictions Precautions Precautions: Fall;Back Precaution Comments: spinal precautions (T12 burst fx); per secure messaging with MD Rudene Christians 6/14--no brace use at this time Restrictions Weight Bearing Restrictions: No      Mobility  Bed Mobility Overal bed mobility: Needs Assistance Bed Mobility: Rolling;Sidelying to Sit Rolling: Min guard Sidelying to sit: Min  guard       General bed mobility comments: vc's for logrolling technique towards L side to sit up on edge of bed  Transfers Overall transfer level: Needs assistance Equipment used: Rolling walker (2 wheeled) Transfers: Sit to/from Stand Sit to Stand: Min guard         General transfer comment: vc's for scooting forward towards edge of sitting surface prior to standing, vc's for UE/LE placement, vc's for overall transfer technique; x1 trial from bed and x1 trial from recliner  Ambulation/Gait Ambulation/Gait assistance: Min guard Gait Distance (Feet): 130 Feet Assistive device: Rolling walker (2 wheeled)   Gait velocity: decreased   General Gait Details: partial step through gait pattern; vc's for walker use initially  Stairs            Wheelchair Mobility    Modified Rankin (Stroke Patients Only)       Balance Overall balance assessment: Needs assistance Sitting-balance support: No upper extremity supported;Feet supported Sitting balance-Leahy Scale: Good Sitting balance - Comments: steady sitting reaching within BOS   Standing balance support: No upper extremity supported Standing balance-Leahy Scale: Fair Standing balance comment: steady static standing                             Pertinent Vitals/Pain Pain Assessment: 0-10 Pain Score: 1  Pain Location: low back Pain Descriptors / Indicators: Discomfort Pain Intervention(s): Limited activity within patient's tolerance;Premedicated before session;Repositioned    Home Living Family/patient expects to be discharged to:: Private residence Living Arrangements: Spouse/significant other Available Help at Discharge: Family;Available 24 hours/day Type of Home: House Home Access:  Stairs to enter Entrance Stairs-Rails: Left Entrance Stairs-Number of Steps: 4 Home Layout: Two level;Able to live on main level with bedroom/bathroom Home Equipment: Shower seat - built in Additional Comments: Has  borrowed RW    Prior Function Level of Independence: Independent         Comments: No falls prior to recent fall in past 6 months     Hand Dominance        Extremity/Trunk Assessment   Upper Extremity Assessment Upper Extremity Assessment: Overall WFL for tasks assessed    Lower Extremity Assessment Lower Extremity Assessment:  (Intact B LE sensation and proprioception; at least 3/5 AROM B hip flexion, knee flexion/extension, and DF/PF)    Cervical / Trunk Assessment Cervical / Trunk Assessment: Normal  Communication   Communication: No difficulties  Cognition Arousal/Alertness: Awake/alert Behavior During Therapy: WFL for tasks assessed/performed Overall Cognitive Status: Within Functional Limits for tasks assessed                                        General Comments   Nursing cleared pt for participation in physical therapy.  Pt agreeable to PT session.  Pt's wife present during session.    Exercises  Spinal precautions with mobility; bed mobility; transfers; ambulation with RW   Assessment/Plan    PT Assessment Patient needs continued PT services  PT Problem List Decreased strength;Decreased activity tolerance;Decreased balance;Decreased mobility;Decreased knowledge of use of DME;Decreased knowledge of precautions;Pain       PT Treatment Interventions DME instruction;Gait training;Stair training;Functional mobility training;Therapeutic activities;Therapeutic exercise;Balance training;Patient/family education    PT Goals (Current goals can be found in the Care Plan section)  Acute Rehab PT Goals Patient Stated Goal: to improve pain and mobility PT Goal Formulation: With patient/family Time For Goal Achievement: 07/28/19 Potential to Achieve Goals: Good    Frequency 7X/week   Barriers to discharge        Co-evaluation               AM-PAC PT "6 Clicks" Mobility  Outcome Measure Help needed turning from your back to your side  while in a flat bed without using bedrails?: A Little Help needed moving from lying on your back to sitting on the side of a flat bed without using bedrails?: A Little Help needed moving to and from a bed to a chair (including a wheelchair)?: A Little Help needed standing up from a chair using your arms (e.g., wheelchair or bedside chair)?: A Little Help needed to walk in hospital room?: A Little Help needed climbing 3-5 steps with a railing? : A Little 6 Click Score: 18    End of Session Equipment Utilized During Treatment: Gait belt (up high away from T12 area) Activity Tolerance: Patient tolerated treatment well Patient left: in chair;with call bell/phone within reach;with chair alarm set;with family/visitor present Nurse Communication: Mobility status;Precautions PT Visit Diagnosis: Other abnormalities of gait and mobility (R26.89);Muscle weakness (generalized) (M62.81);History of falling (Z91.81);Difficulty in walking, not elsewhere classified (R26.2)    Time: 9179-1505 PT Time Calculation (min) (ACUTE ONLY): 54 min   Charges:   PT Evaluation $PT Eval Low Complexity: 1 Low PT Treatments $Gait Training: 8-22 mins $Therapeutic Activity: 8-22 mins       Leitha Bleak, PT 07/14/19, 11:38 AM

## 2019-07-14 NOTE — Discharge Summary (Signed)
Physician Discharge Summary   Joel Ball.  male DOB: 06/10/1950  FBP:[id number]  PCP: Derinda Late, MD  Admit date: 07/13/2019 Discharge date: 07/14/2019  Admitted From: home Disposition:  Home Wife updated at bedside prior to discharge.  Home Health: Yes CODE STATUS: Full code  Discharge Instructions    Discharge instructions   Complete by: As directed    Please follow-up with Dr. Rudene Christians in 2 weeks.   Can continue with gentle daily activities.  Avoid lifting and bending down.   Dr. Enzo Bi - -   Increase activity slowly   Complete by: As directed        Hospital Course:  For full details, please see H&P, progress notes, consult notes and ancillary notes.  Briefly,  Joel Red. is a 69 y.o. Caucasian male with medical history significant for GERD, hypertension, carotid artery stenosis on Plavix and dyslipidemia who presented to the emergency room for evaluation of back pain.  Patient reported that he sustained a fall 2 days prior to presenting to the emergency room, he fell backwards landing on his buttocks and since then has been unable to ambulate.  He rated his back pain a 10 x 10 in intensity at its worst, describes it as a sharp pain mostly in his lower back, worse with movement and nonradiating.  He had taken Percocet at home without any relief of his pain.  He was unable to get out of bed since his fall and had not been able to get up to use the bathroom.  Attempts were made to ambulate the patient in the emergency room and was unsuccessful.  Patient had a lumbar spine MRI which showed acute T12 body fracture with 30% height loss and mild posterior cortex displacement not causing cord compression.  Disc degeneration from L1-2 to L4-5, most notable at L3-4 where there is a right paracentral protrusion effacing the thecal sac at the subarticular recess.  Acute T12 Burst fracture 2/2 mechanical fall CT and MRI shows acute T12 fracture 30% height loss  with extensive edema.  Pt was unable to ambulate PTA.  Ortho was consulted on presentation who had planned a kyphoplasty after pt's plavix washout.  Pt's pain improved sooner than expected with Norco PRN, and was able to be out of bed to work with PT.  Decision therefore was made between ortho and pt to avoid surgery (given his age and comorbidities) and will go with conservative management instead.  Pt was prescribed a short course of Norco at discharge, and ordered HHPT and DME.  Pt will follow up with Dr. Rudene Christians in 2 weeks.   Hypertension Continued Cozaar  History of carotid stenosis Continued statins.  Aspirin and Plavix were held on presentation in anticipation of kyphoplasty and resumed at discharge.  GERD Continued PPI  History of alcohol dependence Patient has 3 drinks daily but denies having any symptoms of alcohol withdrawal when he does not drink. Monitored on CIWA with no withdrawal symptoms.   Discharge Diagnoses:  Principal Problem:   Burst fracture of T12 vertebra (HCC) Active Problems:   Hypertension   Carotid stenosis   GERD (gastroesophageal reflux disease)   Alcohol dependence ([place])    Discharge Instructions:  Allergies as of 07/14/2019      Reactions   Statins    In high doses causes muscle pain   Tizanidine Other (See Comments)   headache      Medication List    STOP taking these medications  naproxen sodium 220 MG tablet Commonly known as: ALEVE     TAKE these medications   aspirin EC 81 MG tablet Take 81 mg by mouth daily.   aspirin-acetaminophen-caffeine 250-250-65 MG tablet Commonly known as: EXCEDRIN MIGRAINE Take 1 tablet by mouth daily as needed for headache.   clopidogrel 75 MG tablet Commonly known as: PLAVIX TAKE 1 TABLET BY MOUTH EVERY DAY WITH BREAKFAST   ibuprofen 200 MG tablet Commonly known as: ADVIL Take 200 mg by mouth 2 (two) times daily as needed for moderate pain.   naproxen 375 MG tablet Commonly known as:  NAPROSYN Take 1 tablet (375 mg total) by mouth 2 (two) times daily with a meal for 10 days.   ondansetron 4 MG disintegrating tablet Commonly known as: Zofran ODT Take 1 tablet (4 mg total) by mouth every 8 (eight) hours as needed.   oxyCODONE 5 MG immediate release tablet Commonly known as: Roxicodone Take 1 tablet (5 mg total) by mouth every 6 (six) hours as needed for up to 5 days.   polyethylene glycol 17 g packet Commonly known as: MiraLax Take 17 g by mouth 2 (two) times daily.   rosuvastatin 5 MG tablet Commonly known as: CRESTOR Take 5 mg by mouth daily at 6 PM.   telmisartan 80 MG tablet Commonly known as: MICARDIS Take by mouth.        Follow-up Information    Hessie Knows, MD. Schedule an appointment as soon as possible for a visit in 2 week(s).   Specialty: Orthopedic Surgery Contact information: [place] [address] [phone]        Derinda Late, MD. Schedule an appointment as soon as possible for a visit in 1 week(s).   Specialty: Family Medicine Contact information: [address] [facility] [place] [address] [phone]               Allergies  Allergen Reactions  . Statins     In high doses causes muscle pain  . Tizanidine Other (See Comments)    headache     The results of significant diagnostics from this hospitalization (including imaging, microbiology, ancillary and laboratory) are listed below for reference.   Consultations:   Procedures/Studies: CT Lumbar Spine Wo Contrast  Result Date: 07/13/2019 CLINICAL DATA:  Fall EXAM: CT LUMBAR SPINE WITHOUT CONTRAST TECHNIQUE: Multidetector CT imaging of the lumbar spine was performed without intravenous contrast administration. Multiplanar CT image reconstructions were also generated. COMPARISON:  None. FINDINGS: Segmentation: 5 lumbar type vertebrae. Alignment: Normal. Vertebrae:  Incomplete burst fracture of T12 with approximately 25% height loss and 4 mm of retropulsion. Paraspinal and other soft tissues: Negative. Disc levels: No spinal canal stenosis. IMPRESSION: Incomplete burst fracture of T12 with approximately 25% height loss and 4 mm of retropulsion. Electronically Signed   By: Ulyses Jarred M.D.   On: 07/13/2019 05:58   MR LUMBAR SPINE WO CONTRAST  Result Date: 07/13/2019 CLINICAL DATA:  Low back pain with cauda equina suspected EXAM: MRI LUMBAR SPINE WITHOUT CONTRAST TECHNIQUE: Multiplanar, multisequence MR imaging of the lumbar spine was performed. No intravenous contrast was administered. COMPARISON:  Lumbar spine CT from earlier today FINDINGS: Segmentation:  5 lumbar type vertebrae Alignment:  Physiologic Vertebrae: Horizontal fracture plane with marrow edema through the T12 body with mild height loss and posterior cortex bulging. Height loss measures 30% when compared to L1. No evidence of bone lesion Conus medullaris and  cauda equina: Conus extends to the L1 level. Conus and cauda equina appear normal. Paraspinal and other soft tissues: No significant finding Disc levels: T12- L1: Unremarkable. L1-L2: Mild disc narrowing and bulging. There is a tiny left paracentral protrusion and annular fissure L2-L3: Mild disc bulging with annular fissuring. L3-L4: Disc narrowing and bulging with a right paracentral protrusion partially effaces the subarticular recess. The foramina are patent L4-L5: Mild annulus bulging and borderline facet spurring L5-S1:Unremarkable. IMPRESSION: 1. Acute T12 body fracture with 30% height loss and mild posterior cortex displacement not causing cord compression. 2. Disc degeneration from L1-2 to L4-5, most notable at L3-4 where there is a right paracentral protrusion effacing the thecal sac at the subarticular recess. Electronically Signed   By: Monte Fantasia M.D.   On: 07/13/2019 07:17      Labs: BNP (last 3 results) No results for input(s):  BNP in the last 8760 hours. Basic Metabolic Panel: Recent Labs  Lab 07/13/19 0545 07/13/19 1030 07/14/19 0634  NA 137 139 138  K 4.2 4.5 4.2  CL 102 102 101  CO2 26 29 27   GLUCOSE 163* 130* 105*  BUN 16 16 15   CREATININE 1.45* 1.33* 1.23  CALCIUM 9.1 9.3 9.0  MG  --  2.3  --   PHOS  --  3.0  --    Liver Function Tests: Recent Labs  Lab 07/13/19 1030  AST 26  ALT 18  ALKPHOS 51  BILITOT 0.8  PROT 7.6  ALBUMIN 4.1   No results for input(s): LIPASE, AMYLASE in the last 168 hours. No results for input(s): AMMONIA in the last 168 hours. CBC: Recent Labs  Lab 07/13/19 0545 07/13/19 1030 07/14/19 0634  WBC 11.1* 10.9* 10.0  NEUTROABS 9.8*  --   --   HGB 15.1 15.4 15.0  HCT 44.5 46.9 43.6  MCV 92.1 95.1 92.0  PLT 194 220 182   Cardiac Enzymes: No results for input(s): CKTOTAL, CKMB, CKMBINDEX, TROPONINI in the last 168 hours. BNP: Invalid input(s): POCBNP CBG: No results for input(s): GLUCAP in the last 168 hours. D-Dimer No results for input(s): DDIMER in the last 72 hours. Hgb A1c No results for input(s): HGBA1C in the last 72 hours. Lipid Profile No results for input(s): CHOL, HDL, LDLCALC, TRIG, CHOLHDL, LDLDIRECT in the last 72 hours. Thyroid function studies No results for input(s): TSH, T4TOTAL, T3FREE, THYROIDAB in the last 72 hours.  Invalid input(s): FREET3 Anemia work up No results for input(s): VITAMINB12, FOLATE, FERRITIN, TIBC, IRON, RETICCTPCT in the last 72 hours. Urinalysis No results found for: COLORURINE, APPEARANCEUR, [place], [place], [place], [place], [place], [place], PROTEINUR, UROBILINOGEN, NITRITE, LEUKOCYTESUR Sepsis Labs Invalid input(s): PROCALCITONIN,  WBC,  LACTICIDVEN Microbiology Recent Results (from the past 240 hour(s))  SARS Coronavirus 2 by RT PCR (hospital order, performed in [facility] hospital lab) Nasopharyngeal Nasopharyngeal Swab     Status: None   Collection Time: 07/13/19 10:09 AM   Specimen:  Nasopharyngeal Swab  Result Value Ref Range Status   SARS Coronavirus 2 NEGATIVE NEGATIVE Final    Comment: (NOTE) SARS-CoV-2 target nucleic acids are NOT DETECTED.  The SARS-CoV-2 RNA is generally detectable in upper and lower respiratory specimens during the acute phase of infection. The lowest concentration of SARS-CoV-2 viral copies this assay can detect is 250 copies / mL. A negative result does not preclude SARS-CoV-2 infection and should not be used as the sole basis for treatment or other patient management decisions.  A negative result may occur with improper  specimen collection / handling, submission of specimen other than nasopharyngeal swab, presence of viral mutation(s) within the areas targeted by this assay, and inadequate number of viral copies (<250 copies / mL). A negative result must be combined with clinical observations, patient history, and epidemiological information.  Fact Sheet for Patients:   StrictlyIdeas.no  Fact Sheet for Healthcare Providers: BankingDealers.co.za  This test is not yet approved or  cleared by the [place] FDA and has been authorized for detection and/or diagnosis of SARS-CoV-2 by FDA under an Emergency Use Authorization (EUA).  This EUA will remain in effect (meaning this test can be used) for the duration of the COVID-19 declaration under Section 564(b)(1) of the Act, 21 U.S.C. section 360bbb-3(b)(1), unless the authorization is terminated or revoked sooner.  Performed at [facility], [address] [place], [place] [address]      Total time spend on discharging this patient, including the last patient exam, discussing the hospital stay, instructions for ongoing care as it relates to all pertinent caregivers, as well as preparing the medical discharge records, prescriptions, and/or referrals as applicable, is 60 minutes.    Enzo Bi, MD  [facility] 07/14/2019, 12:12 PM  If 7PM-7AM, please contact night-coverage

## 2019-07-14 NOTE — Progress Notes (Signed)
Patient was able to get up and walk some with physical therapy today. He seems more comfortable sitting up in the chair and is not having the intense pain with rotation he was over the weekend. My recommendation is to discharge with follow-up with me in 2 weeks unless his pain gets markedly worse.  Resume Plavix and repeat x-ray with me in 2 weeks minimal activities until then along with pain medication.

## 2019-07-14 NOTE — Progress Notes (Signed)
Pt left the floor via wheelchair, DC papers and education given to pt and wife. Pt left with BSC and Walker after OT and PT.

## 2019-07-14 NOTE — TOC Transition Note (Signed)
Transition of Care [facility]) - CM/SW Discharge Note   Patient Details  Name: Joel Ball. MRN: [id number] Date of Birth: 1951/01/16  [facility] CM/SW Contact:  Su Hilt, RN Phone Number: 07/14/2019, 12:23 PM   Clinical Narrative:    Met with the patient to discuss DC plan and needs He lives at home with his wife He needs HH PT, Set up with [facility] He needs a RW and a BSC, I called Zack and set up to be delivered to the room prior to DC, no additional needs   Final next level of care: Home w Home Health Services Barriers to Discharge: Barriers Resolved   Patient Goals and CMS Choice Patient states their goals for this hospitalization and ongoing recovery are:: go home      Discharge Placement                       Discharge Plan and Services   Discharge Planning Services: CM Consult            DME Arranged: 3-N-1, Walker rolling DME Agency: [employer] Date DME Agency Contacted: 07/14/19 Time DME Agency Contacted: 1221 Representative spoke with at DME Agency: zack [place]: PT [place]: [place] ([place]) Date [place]: 07/14/19 Time [place]: 1221 Representative spoke with at [place]: [place] ([place]) Interventions     Readmission Risk Interventions No flowsheet data found.

## 2019-07-15 SURGERY — KYPHOPLASTY
Anesthesia: Choice

## 2019-07-17 LAB — CK ISOENZYMES
CK-BB: 0 %
CK-MB: 0 % (ref 0–3)
CK-MM: 90 % — ABNORMAL LOW (ref 97–100)
Creatine Kinase-Total: 357 U/L — ABNORMAL HIGH (ref 41–331)
Macro Type 1: 10 % — ABNORMAL HIGH
Macro Type 2: 0 %

## 2019-08-15 ENCOUNTER — Other Ambulatory Visit (INDEPENDENT_AMBULATORY_CARE_PROVIDER_SITE_OTHER): Payer: Self-pay | Admitting: Vascular Surgery

## 2020-02-23 ENCOUNTER — Other Ambulatory Visit (INDEPENDENT_AMBULATORY_CARE_PROVIDER_SITE_OTHER): Payer: Self-pay | Admitting: Vascular Surgery

## 2020-06-07 ENCOUNTER — Other Ambulatory Visit (INDEPENDENT_AMBULATORY_CARE_PROVIDER_SITE_OTHER): Payer: Self-pay | Admitting: Nurse Practitioner

## 2020-06-07 DIAGNOSIS — I6523 Occlusion and stenosis of bilateral carotid arteries: Secondary | ICD-10-CM

## 2020-06-08 ENCOUNTER — Ambulatory Visit (INDEPENDENT_AMBULATORY_CARE_PROVIDER_SITE_OTHER): Payer: Medicare Other

## 2020-06-08 ENCOUNTER — Encounter (INDEPENDENT_AMBULATORY_CARE_PROVIDER_SITE_OTHER): Payer: Self-pay | Admitting: Vascular Surgery

## 2020-06-08 ENCOUNTER — Other Ambulatory Visit: Payer: Self-pay

## 2020-06-08 ENCOUNTER — Ambulatory Visit (INDEPENDENT_AMBULATORY_CARE_PROVIDER_SITE_OTHER): Payer: Medicare Other | Admitting: Vascular Surgery

## 2020-06-08 VITALS — BP 118/72 | HR 62 | Resp 16 | Wt 182.0 lb

## 2020-06-08 DIAGNOSIS — E785 Hyperlipidemia, unspecified: Secondary | ICD-10-CM

## 2020-06-08 DIAGNOSIS — I10 Essential (primary) hypertension: Secondary | ICD-10-CM | POA: Diagnosis not present

## 2020-06-08 DIAGNOSIS — I6523 Occlusion and stenosis of bilateral carotid arteries: Secondary | ICD-10-CM

## 2020-06-08 NOTE — Progress Notes (Signed)
MRN : [id number]  Joel Ball. is a 70 y.o. (July 19, 1950) male who presents with chief complaint of  Chief Complaint  Patient presents with  . Follow-up    Ultrasound follow up  .  History of Present Illness: Patient returns in follow-up of his carotid disease.  He has undergone bilateral carotid endarterectomies, the right was about 4 years ago and the left was about 2 years ago.  He is doing well.  He has no complaints today.  He denies any focal neurologic symptoms. Specifically, the patient denies amaurosis fugax, speech or swallowing difficulties, or arm or leg weakness or numbness.  Carotid duplex today reveals bilateral carotid endarterectomy sites are widely patent without recurrent stenosis.  Current Outpatient Medications  Medication Sig Dispense Refill  . aspirin EC 81 MG tablet Take 81 mg by mouth daily.    Marland Kitchen aspirin-acetaminophen-caffeine (EXCEDRIN MIGRAINE) 250-250-65 MG tablet Take 1 tablet by mouth daily as needed for headache.    . clopidogrel (PLAVIX) 75 MG tablet TAKE 1 TABLET BY MOUTH EVERY DAY WITH BREAKFAST 90 tablet 1  . ibuprofen (ADVIL,MOTRIN) 200 MG tablet Take 200 mg by mouth 2 (two) times daily as needed for moderate pain.    Marland Kitchen ondansetron (ZOFRAN ODT) 4 MG disintegrating tablet Take 1 tablet (4 mg total) by mouth every 8 (eight) hours as needed. 20 tablet 0  . polyethylene glycol (MIRALAX) 17 g packet Take 17 g by mouth 2 (two) times daily. 14 each 0  . rosuvastatin (CRESTOR) 5 MG tablet Take 5 mg by mouth daily at 6 PM.     . telmisartan (MICARDIS) 80 MG tablet Take by mouth.     No current facility-administered medications for this visit.    Past Medical History:  Diagnosis Date  . GERD (gastroesophageal reflux disease)   . Hypertension   . Stroke ([place])   . Tinnitus     Past Surgical History:  Procedure Laterality Date  . COLONOSCOPY WITH ESOPHAGOGASTRODUODENOSCOPY (EGD)    . COLONOSCOPY WITH PROPOFOL N/A 05/30/2016   Procedure: COLONOSCOPY  WITH PROPOFOL;  Surgeon: Lollie Sails, MD;  Location[facility] ENDOSCOPY;  Service: Endoscopy;  Laterality: N/A;  . ENDARTERECTOMY Left 09/27/2016   Procedure: ENDARTERECTOMY CAROTID;  Surgeon: Algernon Huxley, MD;  Location: [facility] ORS;  Service: Vascular;  Laterality: Left;  . ENDARTERECTOMY Right 02/13/2018   Procedure: ENDARTERECTOMY CAROTID;  Surgeon: Algernon Huxley, MD;  Location: [facility] ORS;  Service: Vascular;  Laterality: Right;     Social History   Tobacco Use  . Smoking status: Light Tobacco Smoker    Types: Cigars  . Smokeless tobacco: Never Used  . Tobacco comment: three times a year  Vaping Use  . Vaping Use: Never used  Substance Use Topics  . Alcohol use: Yes    Alcohol/week: 15.0 standard drinks    Types: 15 Shots of liquor per week    Comment: 2-3 oz of alcohol per day  . Drug use: No      Family History  Problem Relation Age of Onset  . CAD Neg Hx      Allergies  Allergen Reactions  . Statins     In high doses causes muscle pain  . Tizanidine Other (See Comments)    headache     REVIEW OF SYSTEMS (Negative unless checked)  Constitutional: [] Weight loss  [] Fever  [] Chills Cardiac: [] Chest pain   [] Chest pressure   [] Palpitations   [] Shortness of breath when laying flat   []   Shortness of breath at rest   [] Shortness of breath with exertion. Vascular:  [] Pain in legs with walking   [] Pain in legs at rest   [] Pain in legs when laying flat   [] Claudication   [] Pain in feet when walking  [] Pain in feet at rest  [] Pain in feet when laying flat   [] History of DVT   [] Phlebitis   [] Swelling in legs   [] Varicose veins   [] Non-healing ulcers Pulmonary:   [] Uses home oxygen   [] Productive cough   [] Hemoptysis   [] Wheeze  [] COPD   [] Asthma Neurologic:  [] Dizziness  [] Blackouts   [] Seizures   [] History of stroke   [] History of TIA  [] Aphasia   [x] Temporary blindness   [] Dysphagia   [] Weakness or numbness in arms   [] Weakness or numbness in legs Musculoskeletal:  [] Arthritis    [] Joint swelling   [] Joint pain   [] Low back pain Hematologic:  [] Easy bruising  [] Easy bleeding   [] Hypercoagulable state   [] Anemic  [] Hepatitis Gastrointestinal:  [] Blood in stool   [] Vomiting blood  [x] Gastroesophageal reflux/heartburn   [] Difficulty swallowing. Genitourinary:  [x] Chronic kidney disease   [] Difficult urination  [] Frequent urination  [] Burning with urination   [] Blood in urine Skin:  [] Rashes   [] Ulcers   [] Wounds Psychological:  [] History of anxiety   []  History of major depression.  Physical Examination  Vitals:   06/08/20 1131  BP: 118/72  Pulse: 62  Resp: 16  Weight: 182 lb (82.6 kg)   Body mass index is 27.67 kg/m. Gen:  WD/WN, NAD Head: [place]/AT, No temporalis wasting. Ear/Nose/Throat: Hearing grossly intact, nares w/o erythema or drainage, trachea midline Eyes: Conjunctiva clear. Sclera non-icteric Neck: Supple.  No bruit  Pulmonary:  Good air movement, equal and clear to auscultation bilaterally.  Cardiac: RRR, No JVD Vascular:  Vessel Right Left  Radial Palpable Palpable       Musculoskeletal: M/S 5/5 throughout.  No deformity or atrophy. No edema. Neurologic: CN 2-12 intact. Sensation grossly intact in extremities.  Symmetrical.  Speech is fluent. Motor exam as listed above. Psychiatric: Judgment intact, Mood & affect appropriate for pt's clinical situation. Dermatologic: No rashes or ulcers noted.  No cellulitis or open wounds. CEA incisions well healed.      CBC Lab Results  Component Value Date   WBC 10.0 07/14/2019   HGB 15.0 07/14/2019   HCT 43.6 07/14/2019   MCV 92.0 07/14/2019   PLT 182 07/14/2019    BMET    Component Value Date/Time   NA 138 07/14/2019 0634   K 4.2 07/14/2019 0634   CL 101 07/14/2019 0634   CO2 27 07/14/2019 0634   GLUCOSE 105 (H) 07/14/2019 0634   BUN 15 07/14/2019 0634   CREATININE 1.23 07/14/2019 0634   CALCIUM 9.0 07/14/2019 0634   GFRNONAA 59 (L) 07/14/2019 0634   GFRAA >60 07/14/2019 0634   CrCl  cannot be calculated (Patient's most recent lab result is older than the maximum 21 days allowed.).  COAG Lab Results  Component Value Date   INR 0.97 02/07/2018   INR 0.97 09/20/2016    Radiology No results found.   Assessment/Plan Hypertension blood pressure control important in reducing the progression of atherosclerotic disease. On appropriate oral medications.   Hyperlipidemia lipid control important in reducing the progression of atherosclerotic disease. Continue statin therapy  Carotid stenosis Carotid duplex today reveals bilateral carotid endarterectomy sites are widely patent without recurrent stenosis.  Continue current medical regimen.  Follow on an annual basis.  Leotis Pain, MD  06/08/2020 11:49 AM    This note was created with Dragon medical transcription system.  Any errors from dictation are purely unintentional

## 2020-06-08 NOTE — Assessment & Plan Note (Signed)
Carotid duplex today reveals bilateral carotid endarterectomy sites are widely patent without recurrent stenosis.  Continue current medical regimen.  Follow on an annual basis.

## 2020-07-14 DIAGNOSIS — H34233 Retinal artery branch occlusion, bilateral: Secondary | ICD-10-CM | POA: Diagnosis not present

## 2020-08-26 ENCOUNTER — Other Ambulatory Visit (INDEPENDENT_AMBULATORY_CARE_PROVIDER_SITE_OTHER): Payer: Self-pay | Admitting: Vascular Surgery

## 2020-09-02 DIAGNOSIS — Z79899 Other long term (current) drug therapy: Secondary | ICD-10-CM | POA: Diagnosis not present

## 2020-09-02 DIAGNOSIS — E7800 Pure hypercholesterolemia, unspecified: Secondary | ICD-10-CM | POA: Diagnosis not present

## 2020-09-02 DIAGNOSIS — N1831 Chronic kidney disease, stage 3a: Secondary | ICD-10-CM | POA: Diagnosis not present

## 2020-09-09 DIAGNOSIS — Z1331 Encounter for screening for depression: Secondary | ICD-10-CM | POA: Diagnosis not present

## 2020-09-09 DIAGNOSIS — Z79899 Other long term (current) drug therapy: Secondary | ICD-10-CM | POA: Diagnosis not present

## 2020-09-09 DIAGNOSIS — Z0001 Encounter for general adult medical examination with abnormal findings: Secondary | ICD-10-CM | POA: Diagnosis not present

## 2020-09-09 DIAGNOSIS — Z0000 Encounter for general adult medical examination without abnormal findings: Secondary | ICD-10-CM | POA: Diagnosis not present

## 2020-09-17 DIAGNOSIS — S50861A Insect bite (nonvenomous) of right forearm, initial encounter: Secondary | ICD-10-CM | POA: Diagnosis not present

## 2020-12-07 IMAGING — MR MR LUMBAR SPINE W/O CM
5 series · 31 of 48 positions shown · non-contrast
Comparison: Lumbar spine CT from earlier today

CLINICAL DATA: Low back pain with cauda equina suspected

EXAM:
MRI LUMBAR SPINE WITHOUT CONTRAST
TECHNIQUE: Multiplanar, multisequence MR imaging of the lumbar spine was
performed. No intravenous contrast was administered.

[Series 5: T2 · sagittal · 4.0mm · 0.81mm/px · 6 of 19 slices shown (1 of 2)]
[im 1/19]
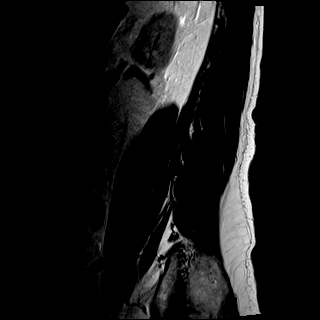
[im 4/19]
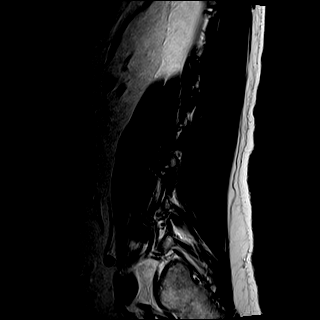
[im 8/19]
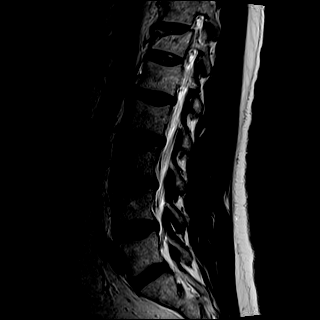
[im 11/19]
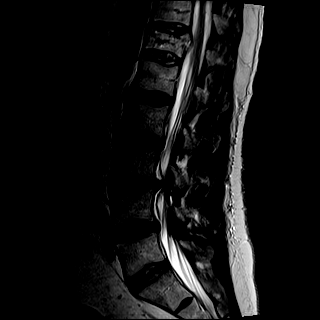
[im 15/19]
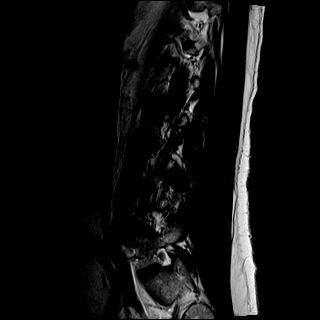
[im 19/19]
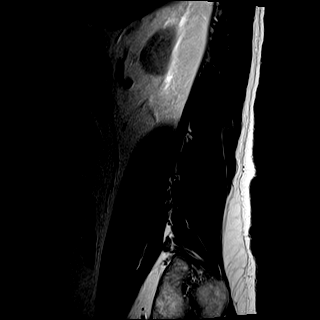

[Series 6: T1 · sagittal · 4.0mm · 0.81mm/px · 6 of 19 slices shown (1 of 2)]
[im 1/19]
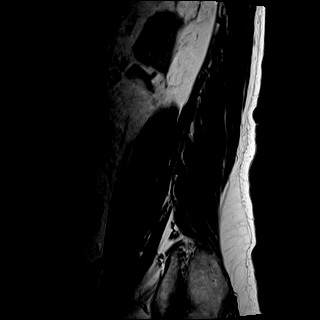
[im 4/19]
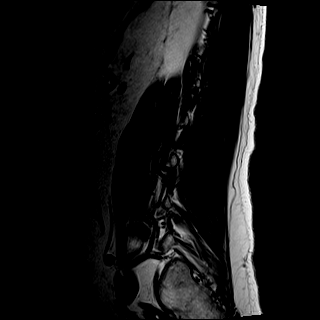
[im 8/19]
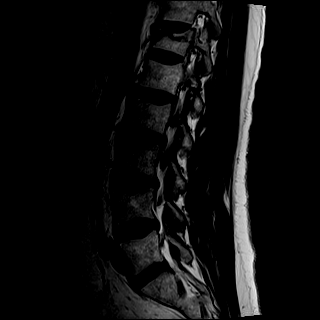
[im 11/19]
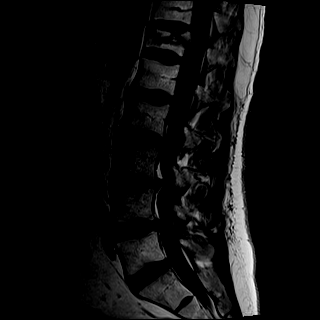
[im 15/19]
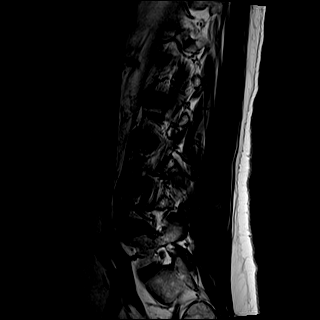
[im 19/19]
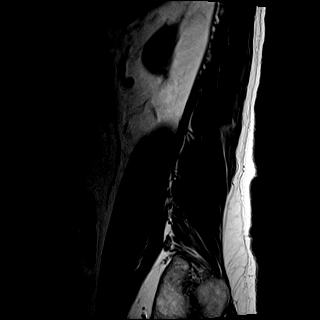

[Series 7: STIR · sagittal · 4.0mm · 0.41mm/px · 1 of 19 slices shown]
[im 1/19]
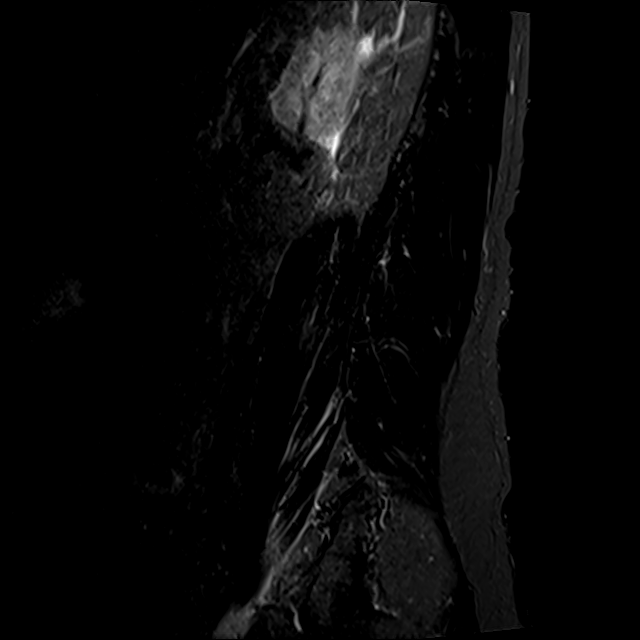

[Series 10: T2 · axial · 4.0mm · 0.66mm/px · z∈[-150,+99]mm · 9 of 47 slices shown (2 of 2)]
[im 1/47]
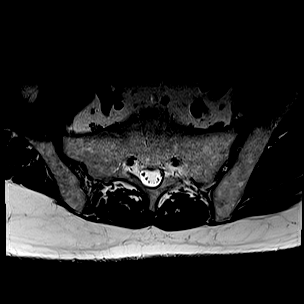
[im 7/47]
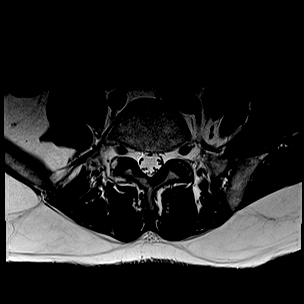
[im 14/47]
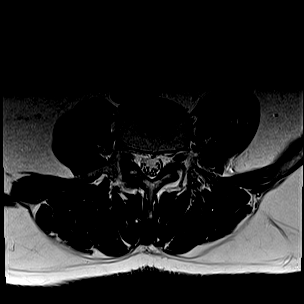
[im 20/47]
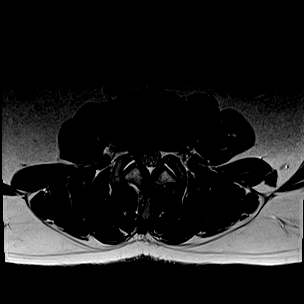
[im 24/47]
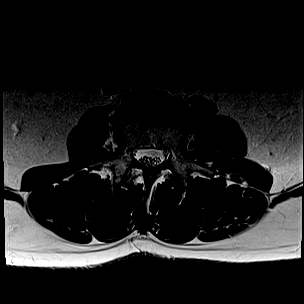
[im 27/47]
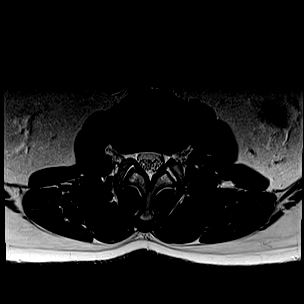
[im 33/47]
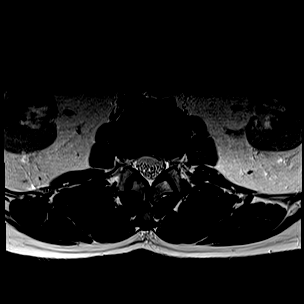
[im 40/47]
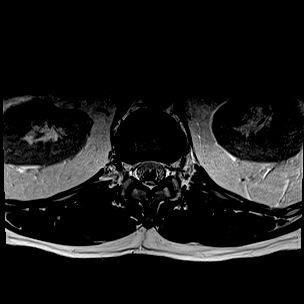
[im 47/47]
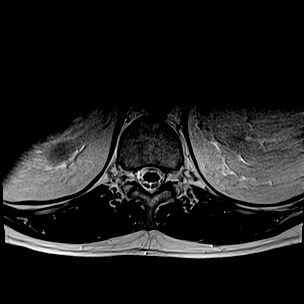

[Series 13: T1 · axial · 4.0mm · 0.39mm/px · z∈[-150,+99]mm · 9 of 47 slices shown (2 of 2)]
[im 1/47]
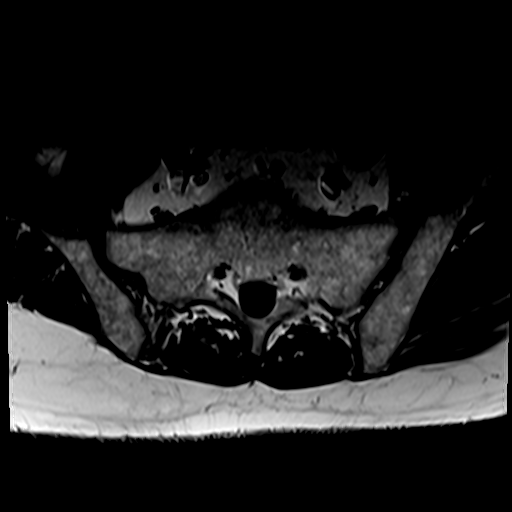
[im 7/47]
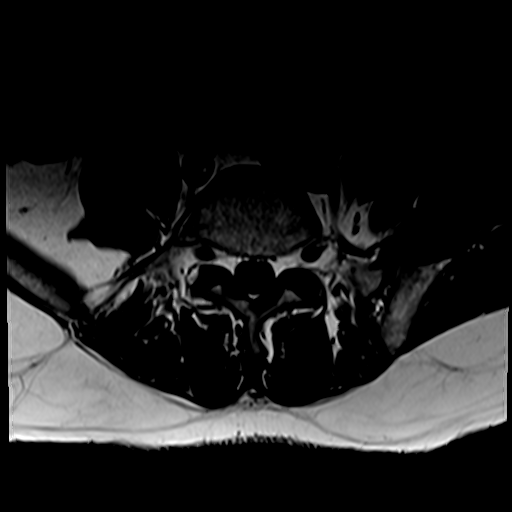
[im 14/47]
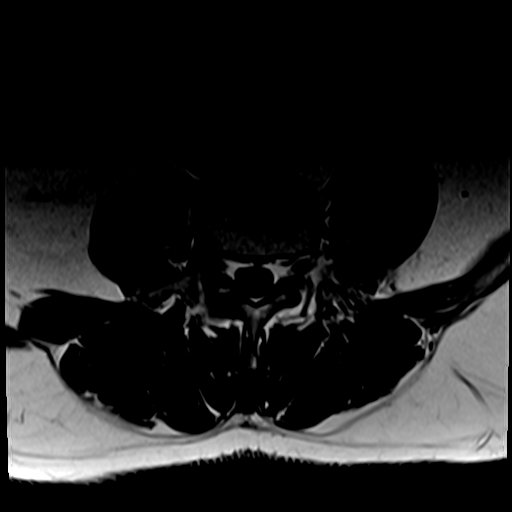
[im 20/47]
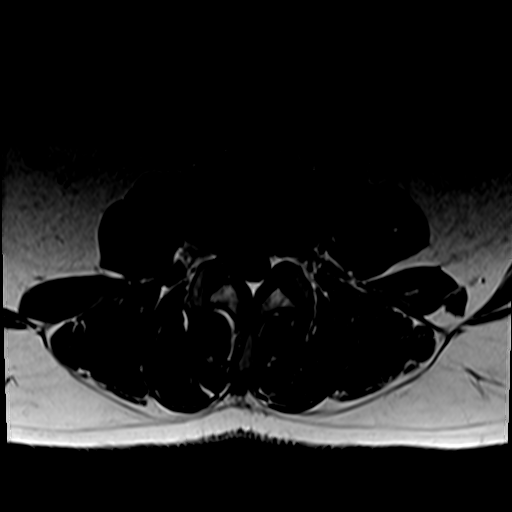
[im 24/47]
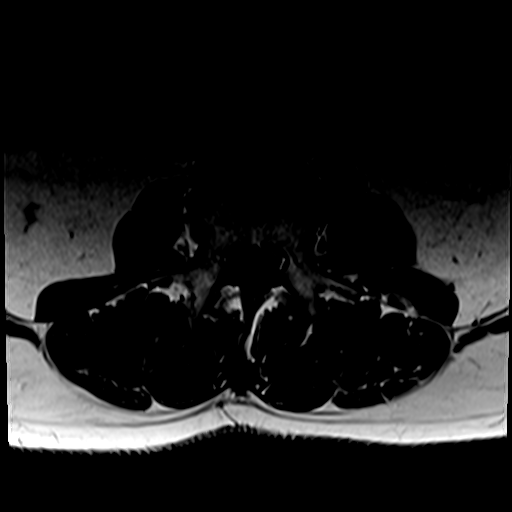
[im 27/47]
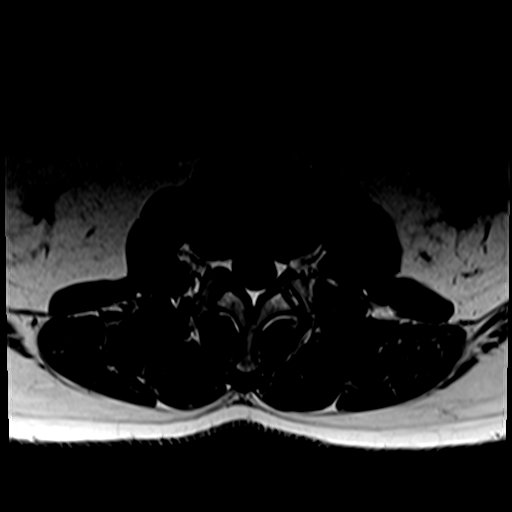
[im 33/47]
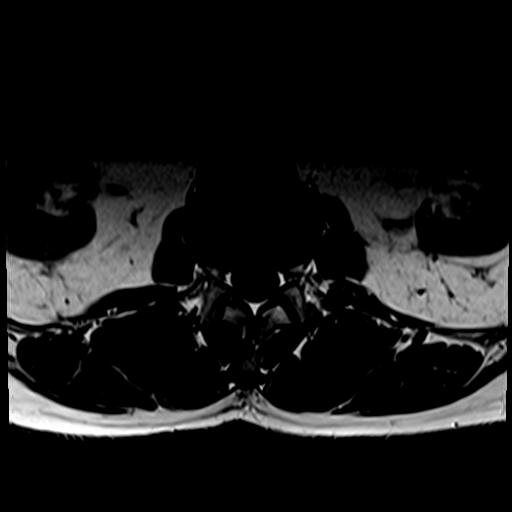
[im 40/47]
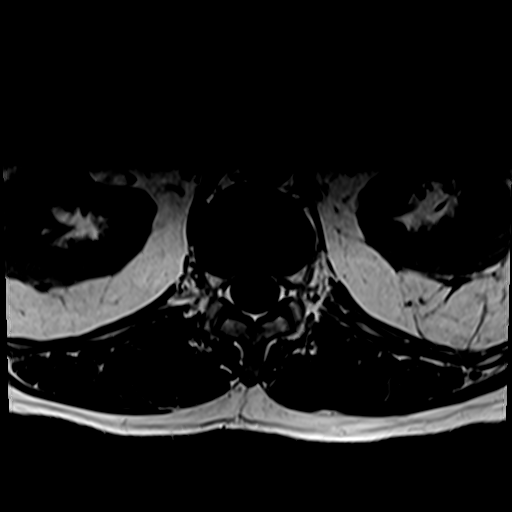
[im 47/47]
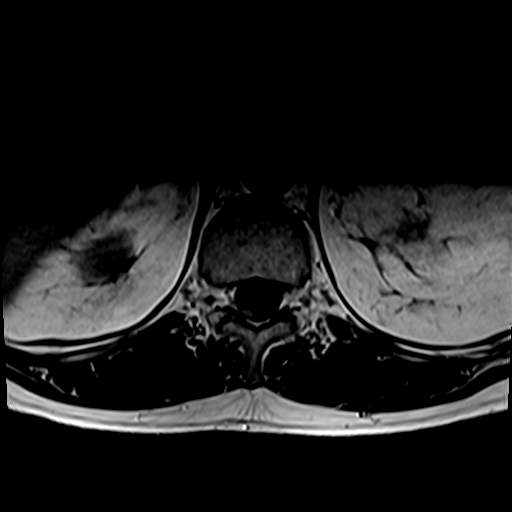

[31 of 48 positions shown; findings below may reference images not displayed]

FINDINGS: Segmentation:  5 lumbar type vertebrae

Alignment:  Physiologic

Vertebrae: Horizontal fracture plane with marrow edema through the
T12 body with mild height loss and posterior cortex bulging. Height
loss measures 30% when compared to L1. No evidence of bone lesion

Conus medullaris and cauda equina: Conus extends to the L1 level.
Conus and cauda equina appear normal.

Paraspinal and other soft tissues: No significant finding

Disc levels:

T12- L1: Unremarkable.

L1-L2: Mild disc narrowing and bulging. There is a tiny left
paracentral protrusion and annular fissure

L2-L3: Mild disc bulging with annular fissuring.

L3-L4: Disc narrowing and bulging with a right paracentral
protrusion partially effaces the subarticular recess. The foramina
are patent

L4-L5: Mild annulus bulging and borderline facet spurring

L5-S1:Unremarkable.
IMPRESSION: 1. Acute T12 body fracture with 30% height loss and mild posterior
cortex displacement not causing cord compression.
2. Disc degeneration from L1-2 to L4-5, most notable at L3-4 where
there is a right paracentral protrusion effacing the thecal sac at
the subarticular recess.

## 2020-12-10 DIAGNOSIS — L814 Other melanin hyperpigmentation: Secondary | ICD-10-CM | POA: Diagnosis not present

## 2020-12-10 DIAGNOSIS — D485 Neoplasm of uncertain behavior of skin: Secondary | ICD-10-CM | POA: Diagnosis not present

## 2020-12-10 DIAGNOSIS — L439 Lichen planus, unspecified: Secondary | ICD-10-CM | POA: Diagnosis not present

## 2020-12-10 DIAGNOSIS — L570 Actinic keratosis: Secondary | ICD-10-CM | POA: Diagnosis not present

## 2020-12-10 DIAGNOSIS — L821 Other seborrheic keratosis: Secondary | ICD-10-CM | POA: Diagnosis not present

## 2020-12-20 DIAGNOSIS — Z23 Encounter for immunization: Secondary | ICD-10-CM | POA: Diagnosis not present

## 2021-02-11 DIAGNOSIS — H524 Presbyopia: Secondary | ICD-10-CM | POA: Diagnosis not present

## 2021-02-24 ENCOUNTER — Other Ambulatory Visit (INDEPENDENT_AMBULATORY_CARE_PROVIDER_SITE_OTHER): Payer: Self-pay | Admitting: Vascular Surgery

## 2021-03-08 DIAGNOSIS — Z125 Encounter for screening for malignant neoplasm of prostate: Secondary | ICD-10-CM | POA: Diagnosis not present

## 2021-03-08 DIAGNOSIS — N1831 Chronic kidney disease, stage 3a: Secondary | ICD-10-CM | POA: Diagnosis not present

## 2021-03-08 DIAGNOSIS — Z79899 Other long term (current) drug therapy: Secondary | ICD-10-CM | POA: Diagnosis not present

## 2021-03-08 DIAGNOSIS — E7800 Pure hypercholesterolemia, unspecified: Secondary | ICD-10-CM | POA: Diagnosis not present

## 2021-03-15 DIAGNOSIS — K219 Gastro-esophageal reflux disease without esophagitis: Secondary | ICD-10-CM | POA: Diagnosis not present

## 2021-03-15 DIAGNOSIS — I129 Hypertensive chronic kidney disease with stage 1 through stage 4 chronic kidney disease, or unspecified chronic kidney disease: Secondary | ICD-10-CM | POA: Diagnosis not present

## 2021-03-15 DIAGNOSIS — E785 Hyperlipidemia, unspecified: Secondary | ICD-10-CM | POA: Diagnosis not present

## 2021-03-15 DIAGNOSIS — N189 Chronic kidney disease, unspecified: Secondary | ICD-10-CM | POA: Diagnosis not present

## 2021-06-13 ENCOUNTER — Other Ambulatory Visit (INDEPENDENT_AMBULATORY_CARE_PROVIDER_SITE_OTHER): Payer: Self-pay | Admitting: Vascular Surgery

## 2021-06-13 DIAGNOSIS — I779 Disorder of arteries and arterioles, unspecified: Secondary | ICD-10-CM

## 2021-06-14 ENCOUNTER — Ambulatory Visit (INDEPENDENT_AMBULATORY_CARE_PROVIDER_SITE_OTHER): Payer: Medicare Other | Admitting: Vascular Surgery

## 2021-06-14 ENCOUNTER — Ambulatory Visit (INDEPENDENT_AMBULATORY_CARE_PROVIDER_SITE_OTHER): Payer: Medicare Other

## 2021-06-14 DIAGNOSIS — I779 Disorder of arteries and arterioles, unspecified: Secondary | ICD-10-CM

## 2021-06-17 ENCOUNTER — Encounter (INDEPENDENT_AMBULATORY_CARE_PROVIDER_SITE_OTHER): Payer: Self-pay | Admitting: *Deleted

## 2021-07-13 DIAGNOSIS — H34233 Retinal artery branch occlusion, bilateral: Secondary | ICD-10-CM | POA: Diagnosis not present

## 2021-08-25 ENCOUNTER — Other Ambulatory Visit (INDEPENDENT_AMBULATORY_CARE_PROVIDER_SITE_OTHER): Payer: Self-pay | Admitting: Vascular Surgery

## 2021-09-05 DIAGNOSIS — E7800 Pure hypercholesterolemia, unspecified: Secondary | ICD-10-CM | POA: Diagnosis not present

## 2021-09-05 DIAGNOSIS — Z79899 Other long term (current) drug therapy: Secondary | ICD-10-CM | POA: Diagnosis not present

## 2021-09-05 DIAGNOSIS — N1831 Chronic kidney disease, stage 3a: Secondary | ICD-10-CM | POA: Diagnosis not present

## 2021-09-12 DIAGNOSIS — M25552 Pain in left hip: Secondary | ICD-10-CM | POA: Diagnosis not present

## 2021-09-12 DIAGNOSIS — Z0000 Encounter for general adult medical examination without abnormal findings: Secondary | ICD-10-CM | POA: Diagnosis not present

## 2021-09-12 DIAGNOSIS — Z0001 Encounter for general adult medical examination with abnormal findings: Secondary | ICD-10-CM | POA: Diagnosis not present

## 2021-09-12 DIAGNOSIS — Z1331 Encounter for screening for depression: Secondary | ICD-10-CM | POA: Diagnosis not present

## 2021-09-14 DIAGNOSIS — M25552 Pain in left hip: Secondary | ICD-10-CM | POA: Diagnosis not present

## 2021-11-10 DIAGNOSIS — Z86010 Personal history of colonic polyps: Secondary | ICD-10-CM | POA: Diagnosis not present

## 2021-11-10 DIAGNOSIS — Z7901 Long term (current) use of anticoagulants: Secondary | ICD-10-CM | POA: Diagnosis not present

## 2021-11-28 ENCOUNTER — Encounter (INDEPENDENT_AMBULATORY_CARE_PROVIDER_SITE_OTHER): Payer: Self-pay

## 2021-11-28 ENCOUNTER — Other Ambulatory Visit (INDEPENDENT_AMBULATORY_CARE_PROVIDER_SITE_OTHER): Payer: Self-pay | Admitting: Vascular Surgery

## 2021-12-08 DIAGNOSIS — D225 Melanocytic nevi of trunk: Secondary | ICD-10-CM | POA: Diagnosis not present

## 2021-12-08 DIAGNOSIS — D2261 Melanocytic nevi of right upper limb, including shoulder: Secondary | ICD-10-CM | POA: Diagnosis not present

## 2021-12-08 DIAGNOSIS — X32XXXA Exposure to sunlight, initial encounter: Secondary | ICD-10-CM | POA: Diagnosis not present

## 2021-12-08 DIAGNOSIS — L578 Other skin changes due to chronic exposure to nonionizing radiation: Secondary | ICD-10-CM | POA: Diagnosis not present

## 2022-02-13 ENCOUNTER — Encounter
Admission: RE | Disposition: A | Discharge status: Home / Self Care | Payer: Self-pay | Source: Ambulatory Visit | Attending: Gastroenterology

## 2022-02-13 ENCOUNTER — Ambulatory Visit: Payer: Medicare Other | Admitting: Anesthesiology

## 2022-02-13 ENCOUNTER — Other Ambulatory Visit: Payer: Self-pay

## 2022-02-13 ENCOUNTER — Ambulatory Visit
Admission: RE | Admit: 2022-02-13 | Discharge: 2022-02-13 | Disposition: A | Discharge status: Home / Self Care | Payer: Medicare Other | Source: Ambulatory Visit | Attending: Gastroenterology | Admitting: Gastroenterology

## 2022-02-13 ENCOUNTER — Encounter: Payer: Self-pay | Admitting: *Deleted

## 2022-02-13 DIAGNOSIS — N183 Chronic kidney disease, stage 3 unspecified: Secondary | ICD-10-CM | POA: Insufficient documentation

## 2022-02-13 DIAGNOSIS — I129 Hypertensive chronic kidney disease with stage 1 through stage 4 chronic kidney disease, or unspecified chronic kidney disease: Secondary | ICD-10-CM | POA: Diagnosis not present

## 2022-02-13 DIAGNOSIS — Z86010 Personal history of colonic polyps: Secondary | ICD-10-CM | POA: Diagnosis not present

## 2022-02-13 DIAGNOSIS — E785 Hyperlipidemia, unspecified: Secondary | ICD-10-CM | POA: Diagnosis not present

## 2022-02-13 DIAGNOSIS — K640 First degree hemorrhoids: Secondary | ICD-10-CM | POA: Diagnosis not present

## 2022-02-13 DIAGNOSIS — Z1211 Encounter for screening for malignant neoplasm of colon: Secondary | ICD-10-CM | POA: Insufficient documentation

## 2022-02-13 DIAGNOSIS — K5790 Diverticulosis of intestine, part unspecified, without perforation or abscess without bleeding: Secondary | ICD-10-CM | POA: Diagnosis not present

## 2022-02-13 DIAGNOSIS — K5730 Diverticulosis of large intestine without perforation or abscess without bleeding: Secondary | ICD-10-CM | POA: Insufficient documentation

## 2022-02-13 DIAGNOSIS — K219 Gastro-esophageal reflux disease without esophagitis: Secondary | ICD-10-CM | POA: Diagnosis not present

## 2022-02-13 DIAGNOSIS — Z8673 Personal history of transient ischemic attack (TIA), and cerebral infarction without residual deficits: Secondary | ICD-10-CM | POA: Diagnosis not present

## 2022-02-13 DIAGNOSIS — I739 Peripheral vascular disease, unspecified: Secondary | ICD-10-CM | POA: Diagnosis not present

## 2022-02-13 DIAGNOSIS — F17290 Nicotine dependence, other tobacco product, uncomplicated: Secondary | ICD-10-CM | POA: Insufficient documentation

## 2022-02-13 DIAGNOSIS — K649 Unspecified hemorrhoids: Secondary | ICD-10-CM | POA: Diagnosis not present

## 2022-02-13 HISTORY — PX: COLONOSCOPY WITH PROPOFOL: SHX5780

## 2022-02-13 SURGERY — COLONOSCOPY WITH PROPOFOL
Anesthesia: General

## 2022-02-13 MED ORDER — PROPOFOL 500 MG/50ML IV EMUL
INTRAVENOUS | Status: DC | PRN
  Administered 2022-02-13: 165 ug/kg/min via INTRAVENOUS

## 2022-02-13 MED ORDER — SODIUM CHLORIDE 0.9 % IV SOLN: INTRAVENOUS | Status: DC

## 2022-02-13 MED ORDER — LIDOCAINE HCL (CARDIAC) PF 100 MG/5ML IV SOSY
PREFILLED_SYRINGE | INTRAVENOUS | Status: DC | PRN
  Administered 2022-02-13: 100 mg via INTRAVENOUS

## 2022-02-13 MED ORDER — PROPOFOL 10 MG/ML IV BOLUS
INTRAVENOUS | Status: DC | PRN
  Administered 2022-02-13: 20 mg via INTRAVENOUS
  Administered 2022-02-13: 70 mg via INTRAVENOUS

## 2022-02-13 NOTE — Op Note (Signed)
[name] Patient Name: Joel Ball Procedure Date: 02/13/2022 1:47 PM MRN: [id number] Account #: [id number] Date of Birth: 10-12-1950 Admit Type: Outpatient Age: 72 Room: [facility] ENDO ROOM 2 Gender: Male Note Status: Finalized Instrument Name: Jasper Riling [id number] Procedure:             Colonoscopy Indications:           Surveillance: Personal history of adenomatous polyps                         on last colonoscopy 5 years ago Providers:             Andrey Farmer MD, MD Referring MD:          Caprice Renshaw MD (Referring MD) Medicines:             Monitored Anesthesia Care Complications:         No immediate complications. Procedure:             Pre-Anesthesia Assessment:                        - Prior to the procedure, a History and Physical was                         performed, and patient medications and allergies were                         reviewed. The patient is competent. The risks and                         benefits of the procedure and the sedation options and                         risks were discussed with the patient. All questions                         were answered and informed consent was obtained.                         Patient identification and proposed procedure were                         verified by the physician, the nurse, the                         anesthesiologist, the anesthetist and the technician                         in the endoscopy suite. Mental Status Examination:                         alert and oriented. Airway Examination: normal                         oropharyngeal airway and neck mobility. Respiratory                         Examination: clear to auscultation. CV Examination:  normal. Prophylactic Antibiotics: The patient does not                         require prophylactic antibiotics. Prior                         Anticoagulants: The patient has taken Plavix                          (clopidogrel), last dose was 8 days prior to                         procedure. ASA Grade Assessment: III - A patient with                         severe systemic disease. After reviewing the risks and                         benefits, the patient was deemed in satisfactory                         condition to undergo the procedure. The anesthesia                         plan was to use monitored anesthesia care (MAC).                         Immediately prior to administration of medications,                         the patient was re-assessed for adequacy to receive                         sedatives. The heart rate, respiratory rate, oxygen                         saturations, blood pressure, adequacy of pulmonary                         ventilation, and response to care were monitored                         throughout the procedure. The physical status of the                         patient was re-assessed after the procedure.                        After obtaining informed consent, the colonoscope was                         passed under direct vision. Throughout the procedure,                         the patient's blood pressure, pulse, and oxygen                         saturations were monitored continuously. The  Colonoscope was introduced through the anus and                         advanced to the the terminal ileum. The colonoscopy                         was performed without difficulty. The patient                         tolerated the procedure well. The quality of the bowel                         preparation was good. The terminal ileum, ileocecal                         valve, appendiceal orifice, and rectum were                         photographed. Findings:      The perianal and digital rectal examinations were normal.      The terminal ileum appeared normal.      Multiple small-mouthed diverticula were found in the sigmoid  colon.      Internal hemorrhoids were found during retroflexion. The hemorrhoids       were Grade I (internal hemorrhoids that do not prolapse).      The exam was otherwise without abnormality on direct and retroflexion       views. Impression:            - The examined portion of the ileum was normal.                        - Diverticulosis in the sigmoid colon.                        - Internal hemorrhoids.                        - The examination was otherwise normal on direct and                         retroflexion views.                        - No specimens collected. Recommendation:        - Discharge patient to home.                        - Resume previous diet.                        - Continue present medications.                        - Repeat colonoscopy is not recommended due to current                         age (20 years or older) for surveillance.                        - Return to referring physician as previously  scheduled. Procedure Code(s):     --- Professional ---                        P3685, Colorectal cancer screening; colonoscopy on                         individual at high risk Diagnosis Code(s):     --- Professional ---                        Z86.010, Personal history of colonic polyps                        K64.0, First degree hemorrhoids                        K57.30, Diverticulosis of large intestine without                         perforation or abscess without bleeding CPT copyright 2022 American [employer]. All rights reserved. The codes documented in this report are preliminary and upon coder review may  be revised to meet current compliance requirements. Andrey Farmer MD, MD 02/13/2022 2:27:55 PM Number of Addenda: 0 Note Initiated On: 02/13/2022 1:47 PM Scope Withdrawal Time: 0 hours 8 minutes 1 second  Total Procedure Duration: 0 hours 12 minutes 7 seconds  Estimated Blood Loss:  Estimated blood loss:  none.      [facility]

## 2022-02-13 NOTE — Interval H&P Note (Signed)
History and Physical Interval Note:  02/13/2022 2:00 PM  Joel Ball.  has presented today for surgery, with the diagnosis of Z86.010 - History of adenomatous polyp of colon.  The various methods of treatment have been discussed with the patient and family. After consideration of risks, benefits and other options for treatment, the patient has consented to  Procedure(s): COLONOSCOPY WITH PROPOFOL (N/A) as a surgical intervention.  The patient's history has been reviewed, patient examined, no change in status, stable for surgery.  I have reviewed the patient's chart and labs.  Questions were answered to the patient's satisfaction.     [name]  Ok to proceed with colonoscopy

## 2022-02-13 NOTE — H&P (Signed)
Outpatient short stay form Pre-procedure 02/13/2022  [name], MD  Primary Physician: Derinda Late, MD  Reason for visit:  Surveillance colonoscopy  History of present illness:    72 y/o gentleman with history of PVD on plavix with last dose over 5 days ago, hypertension, and HLD here for surveillance colonoscopy. Last colonoscopy in 2018 with two small Ta's. No significant abdominal surgeries. No first degree relatives with GI malignancies.    Current Facility-Administered Medications:    0.9 %  sodium chloride infusion, , Intravenous, Continuous, Nyajah Hyson, Hilton Cork, MD, Last Rate: 20 mL/hr at 02/13/22 1337, New Bag at 02/13/22 1337  Medications Prior to Admission  Medication Sig Dispense Refill Last Dose   ibuprofen (ADVIL,MOTRIN) 200 MG tablet Take 200 mg by mouth 2 (two) times daily as needed for moderate pain.   Past Month   omeprazole (PRILOSEC) 20 MG capsule Take 20 mg by mouth daily.   02/10/2022   rosuvastatin (CRESTOR) 5 MG tablet Take 5 mg by mouth daily at 6 PM.    02/12/2022   telmisartan (MICARDIS) 80 MG tablet Take by mouth.   02/12/2022   aspirin EC 81 MG tablet Take 81 mg by mouth daily.   02/05/2022   aspirin-acetaminophen-caffeine (EXCEDRIN MIGRAINE) 250-250-65 MG tablet Take 1 tablet by mouth daily as needed for headache.      clopidogrel (PLAVIX) 75 MG tablet TAKE 1 TABLET BY MOUTH EVERY DAY WITH BREAKFAST 90 tablet 1 02/05/2022   ondansetron (ZOFRAN ODT) 4 MG disintegrating tablet Take 1 tablet (4 mg total) by mouth every 8 (eight) hours as needed. 20 tablet 0    polyethylene glycol (MIRALAX) 17 g packet Take 17 g by mouth 2 (two) times daily. 14 each 0      Allergies  Allergen Reactions   Statins     In high doses causes muscle pain   Tizanidine Other (See Comments)    headache     Past Medical History:  Diagnosis Date   GERD (gastroesophageal reflux disease)    Hypertension    Stroke [facility])    Tinnitus     Review of systems:  Otherwise  negative.    Physical Exam  Gen: Alert, oriented. Appears stated age.  HEENT: PERRLA. Lungs: No respiratory distress CV: RRR Abd: soft, benign, no masses Ext: No edema    Planned procedures: Proceed with colonoscopy. The patient understands the nature of the planned procedure, indications, risks, alternatives and potential complications including but not limited to bleeding, infection, perforation, damage to internal organs and possible oversedation/side effects from anesthesia. The patient agrees and gives consent to proceed.  Please refer to procedure notes for findings, recommendations and patient disposition/instructions.     [name], MD [facility]

## 2022-02-13 NOTE — Anesthesia Procedure Notes (Signed)
Procedure Name: General with mask airway Date/Time: 02/13/2022 2:08 PM  Performed by: Kelton Pillar, CRNAPre-anesthesia Checklist: Patient identified, Emergency Drugs available, Suction available and Patient being monitored Patient Re-evaluated:Patient Re-evaluated prior to induction Oxygen Delivery Method: Simple face mask Induction Type: IV induction Placement Confirmation: positive ETCO2, CO2 detector and breath sounds checked- equal and bilateral Dental Injury: Teeth and Oropharynx as per pre-operative assessment

## 2022-02-13 NOTE — Anesthesia Preprocedure Evaluation (Addendum)
Anesthesia Evaluation  Patient identified by MRN, date of birth, ID band Patient awake    Reviewed: Allergy & Precautions, NPO status , Patient's Chart, lab work & pertinent test results  History of Anesthesia Complications Negative for: history of anesthetic complications  Airway Mallampati: II   Neck ROM: Full    Dental  (+) Missing   Pulmonary  Rare cigar smoker (1-2 per year)   Pulmonary exam normal breath sounds clear to auscultation       Cardiovascular hypertension, + Peripheral Vascular Disease (s/p bilateral CEA)  Normal cardiovascular exam Rhythm:Regular Rate:Normal  Echo 01/24/18:  Normal Stress Echocardiogram   WITH MILD LVH  NORMAL RIGHT VENTRICULAR SYSTOLIC FUNCTION  MILD VALVULAR REGURGITATION  NO VALVULAR STENOSIS NOTED    Neuro/Psych CVA (2019 on Plavix; no residual symptoms)    GI/Hepatic ,GERD  Medicated and Controlled,,  Endo/Other  negative endocrine ROS    Renal/GU Renal disease (stage III CKD)     Musculoskeletal   Abdominal   Peds  Hematology negative hematology ROS (+)   Anesthesia Other Findings   Reproductive/Obstetrics                             Anesthesia Physical Anesthesia Plan  ASA: 3  Anesthesia Plan: General   Post-op Pain Management:    Induction: Intravenous  PONV Risk Score and Plan: 1 and Propofol infusion, TIVA and Treatment may vary due to age or medical condition  Airway Management Planned: Natural Airway  Additional Equipment:   Intra-op Plan:   Post-operative Plan:   Informed Consent: I have reviewed the patients History and Physical, chart, labs and discussed the procedure including the risks, benefits and alternatives for the proposed anesthesia with the patient or authorized representative who has indicated his/her understanding and acceptance.       Plan Discussed with: CRNA  Anesthesia Plan Comments: (LMA/GETA backup  discussed.  Patient consented for risks of anesthesia including but not limited to:  - adverse reactions to medications - damage to eyes, teeth, lips or other oral mucosa - nerve damage due to positioning  - sore throat or hoarseness - damage to heart, brain, nerves, lungs, other parts of body or loss of life  Informed patient about role of CRNA in peri- and intra-operative care.  Patient voiced understanding.)        Anesthesia Quick Evaluation

## 2022-02-13 NOTE — Transfer of Care (Signed)
Immediate Anesthesia Transfer of Care Note  Patient: Yeshaya Vath.  Procedure(s) Performed: COLONOSCOPY WITH PROPOFOL  Patient Location: Endoscopy Unit  Anesthesia Type:General  Level of Consciousness: drowsy and patient cooperative  Airway & Oxygen Therapy: Patient Spontanous Breathing and Patient connected to face mask oxygen  Post-op Assessment: Report given to RN and Post -op Vital signs reviewed and stable  Post vital signs: Reviewed and stable  Last Vitals:  Vitals Value Taken Time  BP 109/68 02/13/22 1421  Temp 36.4 C 02/13/22 1422  Pulse 74 02/13/22 1424  Resp 19 02/13/22 1424  SpO2 98 % 02/13/22 1424  Vitals shown include unvalidated device data.  Last Pain:  Vitals:   02/13/22 1422  TempSrc: Temporal  PainSc: Asleep         Complications: No notable events documented.

## 2022-02-13 NOTE — Anesthesia Postprocedure Evaluation (Signed)
Anesthesia Post Note  Patient: Joel Ball.  Procedure(s) Performed: COLONOSCOPY WITH PROPOFOL  Patient location during evaluation: PACU Anesthesia Type: General Level of consciousness: awake and alert, oriented and patient cooperative Pain management: pain level controlled Vital Signs Assessment: post-procedure vital signs reviewed and stable Respiratory status: spontaneous breathing, nonlabored ventilation and respiratory function stable Cardiovascular status: blood pressure returned to baseline and stable Postop Assessment: adequate PO intake Anesthetic complications: no   No notable events documented.   Last Vitals:  Vitals:   02/13/22 1322 02/13/22 1422  BP: (!) 146/84   Pulse: 79   Resp: 20   Temp: (!) 36.2 C (!) 36.4 C  SpO2: 100%     Last Pain:  Vitals:   02/13/22 1442  TempSrc:   PainSc: 0-No pain                 Darrin Nipper

## 2022-02-14 ENCOUNTER — Encounter: Payer: Self-pay | Admitting: Gastroenterology

## 2022-02-22 DIAGNOSIS — H25813 Combined forms of age-related cataract, bilateral: Secondary | ICD-10-CM | POA: Diagnosis not present

## 2022-03-13 DIAGNOSIS — E7800 Pure hypercholesterolemia, unspecified: Secondary | ICD-10-CM | POA: Diagnosis not present

## 2022-03-13 DIAGNOSIS — Z79899 Other long term (current) drug therapy: Secondary | ICD-10-CM | POA: Diagnosis not present

## 2022-03-13 DIAGNOSIS — N1831 Chronic kidney disease, stage 3a: Secondary | ICD-10-CM | POA: Diagnosis not present

## 2022-03-20 DIAGNOSIS — K219 Gastro-esophageal reflux disease without esophagitis: Secondary | ICD-10-CM | POA: Diagnosis not present

## 2022-03-20 DIAGNOSIS — I129 Hypertensive chronic kidney disease with stage 1 through stage 4 chronic kidney disease, or unspecified chronic kidney disease: Secondary | ICD-10-CM | POA: Diagnosis not present

## 2022-03-20 DIAGNOSIS — E785 Hyperlipidemia, unspecified: Secondary | ICD-10-CM | POA: Diagnosis not present

## 2022-03-20 DIAGNOSIS — N189 Chronic kidney disease, unspecified: Secondary | ICD-10-CM | POA: Diagnosis not present

## 2022-06-09 ENCOUNTER — Other Ambulatory Visit (INDEPENDENT_AMBULATORY_CARE_PROVIDER_SITE_OTHER): Payer: Self-pay | Admitting: Vascular Surgery

## 2022-06-09 DIAGNOSIS — I6523 Occlusion and stenosis of bilateral carotid arteries: Secondary | ICD-10-CM

## 2022-06-13 ENCOUNTER — Ambulatory Visit (INDEPENDENT_AMBULATORY_CARE_PROVIDER_SITE_OTHER): Payer: Medicare Other

## 2022-06-13 ENCOUNTER — Encounter (INDEPENDENT_AMBULATORY_CARE_PROVIDER_SITE_OTHER): Payer: Self-pay | Admitting: Nurse Practitioner

## 2022-06-13 ENCOUNTER — Ambulatory Visit (INDEPENDENT_AMBULATORY_CARE_PROVIDER_SITE_OTHER): Payer: Medicare Other | Admitting: Nurse Practitioner

## 2022-06-13 VITALS — BP 124/75 | HR 66 | Resp 18 | Ht 67.0 in | Wt 182.6 lb

## 2022-06-13 DIAGNOSIS — I6523 Occlusion and stenosis of bilateral carotid arteries: Secondary | ICD-10-CM

## 2022-06-13 DIAGNOSIS — I10 Essential (primary) hypertension: Secondary | ICD-10-CM | POA: Diagnosis not present

## 2022-06-13 DIAGNOSIS — E785 Hyperlipidemia, unspecified: Secondary | ICD-10-CM

## 2022-06-13 NOTE — Progress Notes (Signed)
Subjective:    Patient ID: Joel Peak., male    DOB: 04-Mar-1950, 72 y.o.   MRN: [id number] Chief Complaint  Patient presents with   Follow-up    Follow up 65yr carotid.    The patient is seen for follow up evaluation of carotid stenosis. The carotid stenosis followed by ultrasound.  The patient had a left carotid endarterectomy on 09/27/2016 followed by a right carotid endarterectomy on 02/13/2018.    The patient is taking enteric-coated aspirin 81 mg daily.   There is no history of migraine headaches. There is no history of seizures.   The patient has had no recent episodes of angina or shortness of breath. The patient denies PAD or claudication symptoms. There is a history of hyperlipidemia which is being treated with a statin.     Carotid Duplex done today shows 1-39% stenosis bilaterally.  Antegrade flow in the bilateral vertebral arteries with normal subclavian waveforms.    Review of Systems  All other systems reviewed and are negative.      Objective:   Physical Exam Vitals reviewed.  HENT:     Head: Normocephalic.  Neck:     Vascular: No carotid bruit.  Cardiovascular:     Rate and Rhythm: Normal rate.     Pulses: Normal pulses.  Pulmonary:     Effort: Pulmonary effort is normal.  Skin:    General: Skin is warm and dry.  Neurological:     Mental Status: He is alert and oriented to person, place, and time.  Psychiatric:        Mood and Affect: Mood normal.        Behavior: Behavior normal.        Thought Content: Thought content normal.        Judgment: Judgment normal.     BP 124/75 (BP Location: Right Arm)   Pulse 66   Resp 18   Ht 5\' 7"  (1.702 m)   Wt 182 lb 9.6 oz (82.8 kg)   BMI 28.60 kg/m   Past Medical History:  Diagnosis Date   GERD (gastroesophageal reflux disease)    Hypertension    Stroke (HCC)    Tinnitus     Social History   Socioeconomic History   Marital status: Married    Spouse name: Not on file   Number of  children: Not on file   Years of education: Not on file   Highest education level: Not on file  Occupational History   Not on file  Tobacco Use   Smoking status: Former    Types: Cigars   Smokeless tobacco: Never   Tobacco comments:    three times a year  Vaping Use   Vaping Use: Never used  Substance and Sexual Activity   Alcohol use: Yes    Alcohol/week: 15.0 standard drinks of alcohol    Types: 15 Shots of liquor per week    Comment: 2-3 oz of alcohol per day; stopped drinking in last 3 weeks   Drug use: No   Sexual activity: Not on file  Other Topics Concern   Not on file  Social History Narrative   Not on file   Social Determinants of Health   Financial Resource Strain: Not on file  Food Insecurity: Not on file  Transportation Needs: Not on file  Physical Activity: Not on file  Stress: Not on file  Social Connections: Not on file  Intimate Partner Violence: Not on file    Past  Surgical History:  Procedure Laterality Date   COLONOSCOPY WITH ESOPHAGOGASTRODUODENOSCOPY (EGD)     COLONOSCOPY WITH PROPOFOL N/A 05/30/2016   Procedure: COLONOSCOPY WITH PROPOFOL;  Surgeon: Christena Deem, MD;  Location: [facility] ENDOSCOPY;  Service: Endoscopy;  Laterality: N/A;   COLONOSCOPY WITH PROPOFOL N/A 02/13/2022   Procedure: COLONOSCOPY WITH PROPOFOL;  Surgeon: Regis Bill, MD;  Location: [facility] ENDOSCOPY;  Service: Endoscopy;  Laterality: N/A;   ENDARTERECTOMY Left 09/27/2016   Procedure: ENDARTERECTOMY CAROTID;  Surgeon: Annice Needy, MD;  Location: [facility] ORS;  Service: Vascular;  Laterality: Left;   ENDARTERECTOMY Right 02/13/2018   Procedure: ENDARTERECTOMY CAROTID;  Surgeon: Annice Needy, MD;  Location: [facility] ORS;  Service: Vascular;  Laterality: Right;    Family History  Problem Relation Age of Onset   CAD Neg Hx     Allergies  Allergen Reactions   Statins     In high doses causes muscle pain   Tizanidine Other (See Comments)    headache       Latest Ref Rng &  Units 07/14/2019    6:34 AM 07/13/2019   10:30 AM 07/13/2019    5:45 AM  CBC  WBC 4.0 - 10.5 K/uL 10.0  10.9  11.1   Hemoglobin 13.0 - 17.0 g/dL 78.2  95.6  21.3   Hematocrit 39.0 - 52.0 % 43.6  46.9  44.5   Platelets 150 - 400 K/uL 182  220  194       CMP     Component Value Date/Time   NA 138 07/14/2019 0634   K 4.2 07/14/2019 0634   CL 101 07/14/2019 0634   CO2 27 07/14/2019 0634   GLUCOSE 105 (H) 07/14/2019 0634   BUN 15 07/14/2019 0634   CREATININE 1.23 07/14/2019 0634   CALCIUM 9.0 07/14/2019 0634   PROT 7.6 07/13/2019 1030   ALBUMIN 4.1 07/13/2019 1030   AST 26 07/13/2019 1030   ALT 18 07/13/2019 1030   ALKPHOS 51 07/13/2019 1030   BILITOT 0.8 07/13/2019 1030   GFRNONAA 59 (L) 07/14/2019 0634   GFRAA >60 07/14/2019 0634     No results found.     Assessment & Plan:   1. Bilateral carotid artery stenosis Recommend:  Given the patient's asymptomatic subcritical stenosis no further invasive testing or surgery at this time.  Duplex ultrasound shows 1-39% stenosis bilaterally.  Continue ASA 81 mg therapy as prescribed.  We will have the patient stop his Plavix at this time. Continue management of HTN and Hyperlipidemia Healthy heart diet,  encouraged exercise at least 4 times per week Follow up in 12 months with duplex ultrasound and physical exam   2. Primary hypertension Continue antihypertensive medications as already ordered, these medications have been reviewed and there are no changes at this time.  3. Hyperlipidemia, unspecified hyperlipidemia type Continue statin as ordered and reviewed, no changes at this time   Current Outpatient Medications on File Prior to Visit  Medication Sig Dispense Refill   aspirin EC 81 MG tablet Take 81 mg by mouth daily.     aspirin-acetaminophen-caffeine (EXCEDRIN MIGRAINE) 250-250-65 MG tablet Take 1 tablet by mouth daily as needed for headache.     clopidogrel (PLAVIX) 75 MG tablet TAKE 1 TABLET BY MOUTH EVERY DAY  WITH BREAKFAST 90 tablet 1   ibuprofen (ADVIL,MOTRIN) 200 MG tablet Take 200 mg by mouth 2 (two) times daily as needed for moderate pain.     omeprazole (PRILOSEC) 20 MG capsule Take 20 mg by  mouth daily.     rosuvastatin (CRESTOR) 5 MG tablet Take 5 mg by mouth daily at 6 PM.      telmisartan (MICARDIS) 80 MG tablet Take by mouth.     No current facility-administered medications on file prior to visit.    There are no Patient Instructions on file for this visit. No follow-ups on file.   Georgiana Spinner, NP

## 2022-06-14 ENCOUNTER — Encounter (INDEPENDENT_AMBULATORY_CARE_PROVIDER_SITE_OTHER): Payer: Self-pay

## 2022-07-06 DIAGNOSIS — K080 Exfoliation of teeth due to systemic causes: Secondary | ICD-10-CM | POA: Diagnosis not present

## 2022-07-12 DIAGNOSIS — H34233 Retinal artery branch occlusion, bilateral: Secondary | ICD-10-CM | POA: Diagnosis not present

## 2022-07-12 DIAGNOSIS — H43813 Vitreous degeneration, bilateral: Secondary | ICD-10-CM | POA: Diagnosis not present

## 2022-08-22 DIAGNOSIS — H269 Unspecified cataract: Secondary | ICD-10-CM | POA: Diagnosis not present

## 2022-09-07 DIAGNOSIS — E7800 Pure hypercholesterolemia, unspecified: Secondary | ICD-10-CM | POA: Diagnosis not present

## 2022-09-07 DIAGNOSIS — Z79899 Other long term (current) drug therapy: Secondary | ICD-10-CM | POA: Diagnosis not present

## 2022-09-07 DIAGNOSIS — N1831 Chronic kidney disease, stage 3a: Secondary | ICD-10-CM | POA: Diagnosis not present

## 2022-09-07 DIAGNOSIS — Z125 Encounter for screening for malignant neoplasm of prostate: Secondary | ICD-10-CM | POA: Diagnosis not present

## 2022-09-12 DIAGNOSIS — E785 Hyperlipidemia, unspecified: Secondary | ICD-10-CM | POA: Diagnosis not present

## 2022-09-12 DIAGNOSIS — N189 Chronic kidney disease, unspecified: Secondary | ICD-10-CM | POA: Diagnosis not present

## 2022-09-12 DIAGNOSIS — I129 Hypertensive chronic kidney disease with stage 1 through stage 4 chronic kidney disease, or unspecified chronic kidney disease: Secondary | ICD-10-CM | POA: Diagnosis not present

## 2022-09-12 DIAGNOSIS — K219 Gastro-esophageal reflux disease without esophagitis: Secondary | ICD-10-CM | POA: Diagnosis not present

## 2022-11-13 DIAGNOSIS — Z7982 Long term (current) use of aspirin: Secondary | ICD-10-CM | POA: Diagnosis not present

## 2022-11-13 DIAGNOSIS — N183 Chronic kidney disease, stage 3 unspecified: Secondary | ICD-10-CM | POA: Diagnosis not present

## 2022-11-13 DIAGNOSIS — H52201 Unspecified astigmatism, right eye: Secondary | ICD-10-CM | POA: Diagnosis not present

## 2022-11-13 DIAGNOSIS — Z888 Allergy status to other drugs, medicaments and biological substances status: Secondary | ICD-10-CM | POA: Diagnosis not present

## 2022-11-13 DIAGNOSIS — I129 Hypertensive chronic kidney disease with stage 1 through stage 4 chronic kidney disease, or unspecified chronic kidney disease: Secondary | ICD-10-CM | POA: Diagnosis not present

## 2022-11-13 DIAGNOSIS — Z79899 Other long term (current) drug therapy: Secondary | ICD-10-CM | POA: Diagnosis not present

## 2022-11-13 DIAGNOSIS — Z860101 Personal history of adenomatous and serrated colon polyps: Secondary | ICD-10-CM | POA: Diagnosis not present

## 2022-11-13 DIAGNOSIS — H25812 Combined forms of age-related cataract, left eye: Secondary | ICD-10-CM | POA: Diagnosis not present

## 2022-11-13 DIAGNOSIS — K219 Gastro-esophageal reflux disease without esophagitis: Secondary | ICD-10-CM | POA: Diagnosis not present

## 2022-11-13 DIAGNOSIS — H25811 Combined forms of age-related cataract, right eye: Secondary | ICD-10-CM | POA: Diagnosis not present

## 2022-11-14 DIAGNOSIS — Z961 Presence of intraocular lens: Secondary | ICD-10-CM | POA: Diagnosis not present

## 2022-11-27 DIAGNOSIS — H52202 Unspecified astigmatism, left eye: Secondary | ICD-10-CM | POA: Diagnosis not present

## 2022-11-27 DIAGNOSIS — H25812 Combined forms of age-related cataract, left eye: Secondary | ICD-10-CM | POA: Diagnosis not present

## 2022-11-27 DIAGNOSIS — Z79899 Other long term (current) drug therapy: Secondary | ICD-10-CM | POA: Diagnosis not present

## 2022-11-27 DIAGNOSIS — H34231 Retinal artery branch occlusion, right eye: Secondary | ICD-10-CM | POA: Diagnosis not present

## 2022-11-27 DIAGNOSIS — I6522 Occlusion and stenosis of left carotid artery: Secondary | ICD-10-CM | POA: Diagnosis not present

## 2022-11-27 DIAGNOSIS — N183 Chronic kidney disease, stage 3 unspecified: Secondary | ICD-10-CM | POA: Diagnosis not present

## 2022-11-27 DIAGNOSIS — Z888 Allergy status to other drugs, medicaments and biological substances status: Secondary | ICD-10-CM | POA: Diagnosis not present

## 2022-11-27 DIAGNOSIS — I129 Hypertensive chronic kidney disease with stage 1 through stage 4 chronic kidney disease, or unspecified chronic kidney disease: Secondary | ICD-10-CM | POA: Diagnosis not present

## 2022-11-27 DIAGNOSIS — Z8673 Personal history of transient ischemic attack (TIA), and cerebral infarction without residual deficits: Secondary | ICD-10-CM | POA: Diagnosis not present

## 2022-11-27 DIAGNOSIS — Z7982 Long term (current) use of aspirin: Secondary | ICD-10-CM | POA: Diagnosis not present

## 2023-01-17 DIAGNOSIS — D2261 Melanocytic nevi of right upper limb, including shoulder: Secondary | ICD-10-CM | POA: Diagnosis not present

## 2023-01-17 DIAGNOSIS — D2262 Melanocytic nevi of left upper limb, including shoulder: Secondary | ICD-10-CM | POA: Diagnosis not present

## 2023-01-17 DIAGNOSIS — D2272 Melanocytic nevi of left lower limb, including hip: Secondary | ICD-10-CM | POA: Diagnosis not present

## 2023-01-17 DIAGNOSIS — D225 Melanocytic nevi of trunk: Secondary | ICD-10-CM | POA: Diagnosis not present

## 2023-01-18 DIAGNOSIS — K080 Exfoliation of teeth due to systemic causes: Secondary | ICD-10-CM | POA: Diagnosis not present

## 2023-03-12 DIAGNOSIS — E7800 Pure hypercholesterolemia, unspecified: Secondary | ICD-10-CM | POA: Diagnosis not present

## 2023-03-12 DIAGNOSIS — N1831 Chronic kidney disease, stage 3a: Secondary | ICD-10-CM | POA: Diagnosis not present

## 2023-03-19 DIAGNOSIS — Z1331 Encounter for screening for depression: Secondary | ICD-10-CM | POA: Diagnosis not present

## 2023-03-19 DIAGNOSIS — Z79899 Other long term (current) drug therapy: Secondary | ICD-10-CM | POA: Diagnosis not present

## 2023-03-19 DIAGNOSIS — Z125 Encounter for screening for malignant neoplasm of prostate: Secondary | ICD-10-CM | POA: Diagnosis not present

## 2023-03-19 DIAGNOSIS — Z0000 Encounter for general adult medical examination without abnormal findings: Secondary | ICD-10-CM | POA: Diagnosis not present

## 2023-05-31 ENCOUNTER — Other Ambulatory Visit (INDEPENDENT_AMBULATORY_CARE_PROVIDER_SITE_OTHER): Payer: Self-pay | Admitting: Vascular Surgery

## 2023-05-31 DIAGNOSIS — I6523 Occlusion and stenosis of bilateral carotid arteries: Secondary | ICD-10-CM

## 2023-06-11 ENCOUNTER — Ambulatory Visit (INDEPENDENT_AMBULATORY_CARE_PROVIDER_SITE_OTHER): Payer: Medicare Other | Admitting: Nurse Practitioner

## 2023-06-11 ENCOUNTER — Ambulatory Visit (INDEPENDENT_AMBULATORY_CARE_PROVIDER_SITE_OTHER): Payer: Medicare Other

## 2023-06-11 ENCOUNTER — Encounter (INDEPENDENT_AMBULATORY_CARE_PROVIDER_SITE_OTHER): Payer: Self-pay | Admitting: Nurse Practitioner

## 2023-06-11 VITALS — BP 125/70 | HR 64 | Resp 16 | Ht 67.0 in | Wt 185.6 lb

## 2023-06-11 DIAGNOSIS — I6523 Occlusion and stenosis of bilateral carotid arteries: Secondary | ICD-10-CM | POA: Diagnosis not present

## 2023-06-11 DIAGNOSIS — I10 Essential (primary) hypertension: Secondary | ICD-10-CM | POA: Diagnosis not present

## 2023-06-11 DIAGNOSIS — E785 Hyperlipidemia, unspecified: Secondary | ICD-10-CM | POA: Diagnosis not present

## 2023-06-11 NOTE — Progress Notes (Signed)
 Subjective:    Patient ID: Joel Newport., male    DOB: [birth year], 73 y.o.   MRN: [id number] Chief Complaint  Patient presents with   Venous Insufficiency    The patient is seen for follow up evaluation of carotid stenosis. The carotid stenosis followed by ultrasound.  The patient had a left carotid endarterectomy on 09/27/2016 followed by a right carotid endarterectomy on 02/13/2018.    The patient is taking enteric-coated aspirin  81 mg daily.   There is no history of migraine headaches. There is no history of seizures.   The patient has had no recent episodes of angina or shortness of breath. The patient denies PAD or claudication symptoms. There is a history of hyperlipidemia which is being treated with a statin.     Carotid Duplex done today shows no significant stenosis bilaterally.  Antegrade flow in the bilateral vertebral arteries with normal subclavian waveforms.    Review of Systems  All other systems reviewed and are negative.      Objective:   Physical Exam Vitals reviewed.  HENT:     Head: Normocephalic.  Neck:     Vascular: No carotid bruit.  Cardiovascular:     Rate and Rhythm: Normal rate.     Pulses: Normal pulses.  Pulmonary:     Effort: Pulmonary effort is normal.  Skin:    General: Skin is warm and dry.  Neurological:     Mental Status: He is alert and oriented to person, place, and time.  Psychiatric:        Mood and Affect: Mood normal.        Behavior: Behavior normal.        Thought Content: Thought content normal.        Judgment: Judgment normal.     BP 125/70 (BP Location: Left Arm, Patient Position: Sitting, Cuff Size: Normal)   Pulse 64   Resp 16   Ht 5\' 7"  (1.702 m)   Wt 185 lb 9.6 oz (84.2 kg)   BMI 29.07 kg/m   Past Medical History:  Diagnosis Date   GERD (gastroesophageal reflux disease)    Hypertension    Stroke (HCC)    Tinnitus     Social History   Socioeconomic History   Marital status: Married    Spouse  name: Not on file   Number of children: Not on file   Years of education: Not on file   Highest education level: Not on file  Occupational History   Not on file  Tobacco Use   Smoking status: Former    Types: Cigars   Smokeless tobacco: Never   Tobacco comments:    three times a year  Vaping Use   Vaping status: Never Used  Substance and Sexual Activity   Alcohol use: Yes    Alcohol/week: 15.0 standard drinks of alcohol    Types: 15 Shots of liquor per week    Comment: 2-3 oz of alcohol per day; stopped drinking in last 3 weeks   Drug use: No   Sexual activity: Not on file  Other Topics Concern   Not on file  Social History Narrative   Not on file   Social Drivers of Health   Financial Resource Strain: Low Risk  (03/19/2023)   Received from [facility]   Overall Financial Resource Strain (CARDIA)    Difficulty of Paying Living Expenses: Not hard at all  Food Insecurity: No Food Insecurity (03/19/2023)   Received from  [facility]   Hunger Vital Sign    Worried About Running Out of Food in the Last Year: Never true    Ran Out of Food in the Last Year: Never true  Transportation Needs: No Transportation Needs (03/19/2023)   Received [facility] - Transportation    In the past 12 months, has lack of transportation kept you from medical appointments or from getting medications?: No    Lack of Transportation (Non-Medical): No  Physical Activity: Not on file  Stress: Not on file  Social Connections: Not on file  Intimate Partner Violence: Not on file    Past Surgical History:  Procedure Laterality Date   COLONOSCOPY WITH ESOPHAGOGASTRODUODENOSCOPY (EGD)     COLONOSCOPY WITH PROPOFOL  N/A 05/30/2016   Procedure: COLONOSCOPY WITH PROPOFOL ;  Surgeon: Deveron Fly, MD;  Location: [facility] ENDOSCOPY;  Service: Endoscopy;  Laterality: N/A;   COLONOSCOPY WITH PROPOFOL  N/A 02/13/2022   Procedure: COLONOSCOPY WITH  PROPOFOL ;  Surgeon: Shane Darling, MD;  Location: [facility] ENDOSCOPY;  Service: Endoscopy;  Laterality: N/A;   ENDARTERECTOMY Left 09/27/2016   Procedure: ENDARTERECTOMY CAROTID;  Surgeon: Celso College, MD;  Location: [facility] ORS;  Service: Vascular;  Laterality: Left;   ENDARTERECTOMY Right 02/13/2018   Procedure: ENDARTERECTOMY CAROTID;  Surgeon: Celso College, MD;  Location: [facility] ORS;  Service: Vascular;  Laterality: Right;    Family History  Problem Relation Age of Onset   CAD Neg Hx     Allergies  Allergen Reactions   Statins     In high doses causes muscle pain   Tizanidine Other (See Comments)    headache       Latest Ref Rng & Units 07/14/2019    6:34 AM 07/13/2019   10:30 AM 07/13/2019    5:45 AM  CBC  WBC 4.0 - 10.5 K/uL 10.0  10.9  11.1   Hemoglobin 13.0 - 17.0 g/dL 16.1  09.6  04.5   Hematocrit 39.0 - 52.0 % 43.6  46.9  44.5   Platelets 150 - 400 K/uL 182  220  194       CMP     Component Value Date/Time   NA 138 07/14/2019 0634   K 4.2 07/14/2019 0634   CL 101 07/14/2019 0634   CO2 27 07/14/2019 0634   GLUCOSE 105 (H) 07/14/2019 0634   BUN 15 07/14/2019 0634   CREATININE 1.23 07/14/2019 0634   CALCIUM  9.0 07/14/2019 0634   PROT 7.6 07/13/2019 1030   ALBUMIN 4.1 07/13/2019 1030   AST 26 07/13/2019 1030   ALT 18 07/13/2019 1030   ALKPHOS 51 07/13/2019 1030   BILITOT 0.8 07/13/2019 1030   GFRNONAA 59 (L) 07/14/2019 0634   GFRAA >60 07/14/2019 0634     No results found.     Assessment & Plan:   1. Bilateral carotid artery stenosis Recommend:  Given the patient's asymptomatic subcritical stenosis no further invasive testing or surgery at this time.  Duplex ultrasound shows <40% stenosis bilaterally.  Continue ASA 81 mg therapy as prescribed.  We will have the patient stop his Plavix  at this time. Continue management of HTN and Hyperlipidemia Healthy heart diet,  encouraged exercise at least 4 times per week Follow up in 12 months with duplex  ultrasound and physical exam   2. Primary hypertension Continue antihypertensive medications as already ordered, these medications have been reviewed and there are no changes at this time.  3. Hyperlipidemia, unspecified hyperlipidemia  type Continue statin as ordered and reviewed, no changes at this time   Current Outpatient Medications on File Prior to Visit  Medication Sig Dispense Refill   aspirin  EC 81 MG tablet Take 81 mg by mouth daily.     aspirin -acetaminophen -caffeine  (EXCEDRIN  MIGRAINE) 250-250-65 MG tablet Take 1 tablet by mouth daily as needed for headache.     ibuprofen  (ADVIL ,MOTRIN ) 200 MG tablet Take 200 mg by mouth 2 (two) times daily as needed for moderate pain.     omeprazole (PRILOSEC) 20 MG capsule Take 20 mg by mouth daily.     rosuvastatin  (CRESTOR ) 5 MG tablet Take 5 mg by mouth daily at 6 PM.      telmisartan (MICARDIS) 80 MG tablet Take by mouth.     No current facility-administered medications on file prior to visit.    There are no Patient Instructions on file for this visit. No follow-ups on file.   Joel Lucus E Lynnita Somma, NP

## 2023-06-19 ENCOUNTER — Encounter (INDEPENDENT_AMBULATORY_CARE_PROVIDER_SITE_OTHER): Payer: Self-pay

## 2023-09-03 DIAGNOSIS — E7800 Pure hypercholesterolemia, unspecified: Secondary | ICD-10-CM | POA: Diagnosis not present

## 2023-09-03 DIAGNOSIS — R7303 Prediabetes: Secondary | ICD-10-CM | POA: Diagnosis not present

## 2023-09-03 DIAGNOSIS — Z125 Encounter for screening for malignant neoplasm of prostate: Secondary | ICD-10-CM | POA: Diagnosis not present

## 2023-09-03 DIAGNOSIS — Z79899 Other long term (current) drug therapy: Secondary | ICD-10-CM | POA: Diagnosis not present

## 2023-09-03 DIAGNOSIS — N1831 Chronic kidney disease, stage 3a: Secondary | ICD-10-CM | POA: Diagnosis not present

## 2023-09-10 DIAGNOSIS — E785 Hyperlipidemia, unspecified: Secondary | ICD-10-CM | POA: Diagnosis not present

## 2023-09-10 DIAGNOSIS — I129 Hypertensive chronic kidney disease with stage 1 through stage 4 chronic kidney disease, or unspecified chronic kidney disease: Secondary | ICD-10-CM | POA: Diagnosis not present

## 2023-09-10 DIAGNOSIS — N189 Chronic kidney disease, unspecified: Secondary | ICD-10-CM | POA: Diagnosis not present

## 2023-09-10 DIAGNOSIS — K219 Gastro-esophageal reflux disease without esophagitis: Secondary | ICD-10-CM | POA: Diagnosis not present

## 2023-09-19 DIAGNOSIS — Z961 Presence of intraocular lens: Secondary | ICD-10-CM | POA: Diagnosis not present

## 2023-09-19 DIAGNOSIS — H3562 Retinal hemorrhage, left eye: Secondary | ICD-10-CM | POA: Diagnosis not present

## 2023-09-19 DIAGNOSIS — H34233 Retinal artery branch occlusion, bilateral: Secondary | ICD-10-CM | POA: Diagnosis not present

## 2023-09-19 DIAGNOSIS — H43813 Vitreous degeneration, bilateral: Secondary | ICD-10-CM | POA: Diagnosis not present

## 2024-06-10 ENCOUNTER — Ambulatory Visit (INDEPENDENT_AMBULATORY_CARE_PROVIDER_SITE_OTHER): Admitting: Vascular Surgery

## 2024-06-10 ENCOUNTER — Encounter (INDEPENDENT_AMBULATORY_CARE_PROVIDER_SITE_OTHER)
